# Patient Record
Sex: Female | Born: 1950 | Race: White | Hispanic: No | Marital: Married | Smoking: Former smoker
Health system: Southern US, Community
[De-identification: ages and names within clinical notes are randomized; demographics above are authoritative.]

## PROBLEM LIST (undated history)

## (undated) DIAGNOSIS — F32A Depression, unspecified: Secondary | ICD-10-CM

## (undated) DIAGNOSIS — F419 Anxiety disorder, unspecified: Secondary | ICD-10-CM

## (undated) DIAGNOSIS — R002 Palpitations: Secondary | ICD-10-CM

## (undated) DIAGNOSIS — F329 Major depressive disorder, single episode, unspecified: Secondary | ICD-10-CM

## (undated) DIAGNOSIS — F4310 Post-traumatic stress disorder, unspecified: Secondary | ICD-10-CM

## (undated) DIAGNOSIS — J189 Pneumonia, unspecified organism: Secondary | ICD-10-CM

## (undated) DIAGNOSIS — K635 Polyp of colon: Secondary | ICD-10-CM

## (undated) DIAGNOSIS — E785 Hyperlipidemia, unspecified: Secondary | ICD-10-CM

## (undated) DIAGNOSIS — A6920 Lyme disease, unspecified: Secondary | ICD-10-CM

## (undated) DIAGNOSIS — D126 Benign neoplasm of colon, unspecified: Secondary | ICD-10-CM

## (undated) DIAGNOSIS — T8859XA Other complications of anesthesia, initial encounter: Secondary | ICD-10-CM

## (undated) DIAGNOSIS — Z9889 Other specified postprocedural states: Secondary | ICD-10-CM

## (undated) DIAGNOSIS — S069X9A Unspecified intracranial injury with loss of consciousness of unspecified duration, initial encounter: Secondary | ICD-10-CM

## (undated) DIAGNOSIS — T4145XA Adverse effect of unspecified anesthetic, initial encounter: Secondary | ICD-10-CM

## (undated) DIAGNOSIS — R51 Headache: Secondary | ICD-10-CM

## (undated) DIAGNOSIS — R112 Nausea with vomiting, unspecified: Secondary | ICD-10-CM

## (undated) DIAGNOSIS — M5126 Other intervertebral disc displacement, lumbar region: Secondary | ICD-10-CM

## (undated) DIAGNOSIS — M797 Fibromyalgia: Secondary | ICD-10-CM

## (undated) HISTORY — DX: Hyperlipidemia, unspecified: E78.5

## (undated) HISTORY — PX: TONSILLECTOMY: SHX5217

## (undated) HISTORY — DX: Fibromyalgia: M79.7

## (undated) HISTORY — DX: Lyme disease, unspecified: A69.20

## (undated) HISTORY — PX: WISDOM TOOTH EXTRACTION: SHX21

## (undated) HISTORY — DX: Polyp of colon: K63.5

## (undated) HISTORY — PX: TONSILLECTOMY: SUR1361

## (undated) HISTORY — DX: Benign neoplasm of colon, unspecified: D12.6

## (undated) HISTORY — DX: Depression, unspecified: F32.A

## (undated) HISTORY — PX: BREAST BIOPSY: SHX20

## (undated) HISTORY — DX: Palpitations: R00.2

## (undated) HISTORY — DX: Pneumonia, unspecified organism: J18.9

## (undated) HISTORY — DX: Major depressive disorder, single episode, unspecified: F32.9

## (undated) HISTORY — DX: Headache: R51

## (undated) HISTORY — DX: Anxiety disorder, unspecified: F41.9

## (undated) HISTORY — PX: HAND SURGERY: SHX662

## (undated) HISTORY — DX: Other intervertebral disc displacement, lumbar region: M51.26

## (undated) HISTORY — DX: Unspecified intracranial injury with loss of consciousness of unspecified duration, initial encounter: S06.9X9A

## (undated) HISTORY — DX: Post-traumatic stress disorder, unspecified: F43.10

---
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
## 1976-12-24 HISTORY — PX: BREAST SURGERY: SHX581

## 1982-12-24 HISTORY — PX: TUBAL LIGATION: SHX77

## 1997-12-24 HISTORY — PX: NECK SURGERY: SHX720

## 1998-12-24 HISTORY — PX: NECK SURGERY: SHX720

## 2006-01-04 ENCOUNTER — Other Ambulatory Visit: Admission: RE | Admit: 2006-01-04 | Discharge: 2006-01-04 | Payer: Self-pay | Admitting: Obstetrics and Gynecology

## 2006-06-21 ENCOUNTER — Ambulatory Visit: Payer: Self-pay | Admitting: Family Medicine

## 2006-07-03 ENCOUNTER — Ambulatory Visit: Payer: Self-pay

## 2006-12-20 ENCOUNTER — Ambulatory Visit: Payer: Self-pay | Admitting: Family Medicine

## 2006-12-24 DIAGNOSIS — K635 Polyp of colon: Secondary | ICD-10-CM

## 2006-12-24 HISTORY — DX: Polyp of colon: K63.5

## 2007-03-19 ENCOUNTER — Ambulatory Visit: Payer: Self-pay | Admitting: Family Medicine

## 2007-08-28 DIAGNOSIS — R519 Headache, unspecified: Secondary | ICD-10-CM | POA: Insufficient documentation

## 2007-08-28 DIAGNOSIS — R51 Headache: Secondary | ICD-10-CM

## 2007-08-28 DIAGNOSIS — F329 Major depressive disorder, single episode, unspecified: Secondary | ICD-10-CM

## 2007-09-11 ENCOUNTER — Encounter: Payer: Self-pay | Admitting: Family Medicine

## 2007-11-26 ENCOUNTER — Encounter: Payer: Self-pay | Admitting: Family Medicine

## 2008-07-24 LAB — HM COLONOSCOPY: HM Colonoscopy: NORMAL

## 2008-08-24 ENCOUNTER — Encounter: Payer: Self-pay | Admitting: Family Medicine

## 2009-01-10 ENCOUNTER — Encounter: Payer: Self-pay | Admitting: Family Medicine

## 2009-12-24 DIAGNOSIS — S069X9A Unspecified intracranial injury with loss of consciousness of unspecified duration, initial encounter: Secondary | ICD-10-CM

## 2009-12-24 HISTORY — PX: NECK SURGERY: SHX720

## 2009-12-24 HISTORY — DX: Unspecified intracranial injury with loss of consciousness of unspecified duration, initial encounter: S06.9X9A

## 2010-01-04 ENCOUNTER — Encounter (INDEPENDENT_AMBULATORY_CARE_PROVIDER_SITE_OTHER): Payer: Self-pay | Admitting: *Deleted

## 2010-01-19 ENCOUNTER — Emergency Department (HOSPITAL_COMMUNITY): Admission: EM | Admit: 2010-01-19 | Discharge: 2010-01-19 | Payer: Self-pay | Admitting: Emergency Medicine

## 2010-01-19 ENCOUNTER — Encounter: Payer: Self-pay | Admitting: Family Medicine

## 2010-02-28 ENCOUNTER — Encounter
Admission: RE | Admit: 2010-02-28 | Discharge: 2010-02-28 | Payer: Self-pay | Admitting: Physical Medicine and Rehabilitation

## 2010-03-24 ENCOUNTER — Inpatient Hospital Stay (HOSPITAL_COMMUNITY): Admission: RE | Admit: 2010-03-24 | Discharge: 2010-03-25 | Payer: Self-pay | Admitting: *Deleted

## 2010-03-24 HISTORY — PX: ANTERIOR CERVICAL DECOMP/DISCECTOMY FUSION: SHX1161

## 2010-04-24 ENCOUNTER — Encounter
Admission: RE | Admit: 2010-04-24 | Discharge: 2010-07-23 | Payer: Self-pay | Source: Home / Self Care | Attending: *Deleted | Admitting: *Deleted

## 2010-07-24 ENCOUNTER — Encounter: Admission: RE | Admit: 2010-07-24 | Discharge: 2010-09-18 | Payer: Self-pay | Admitting: *Deleted

## 2010-07-24 LAB — CONVERTED CEMR LAB: Pap Smear: NORMAL

## 2010-09-05 ENCOUNTER — Ambulatory Visit: Payer: Self-pay | Admitting: Family

## 2010-09-05 DIAGNOSIS — D179 Benign lipomatous neoplasm, unspecified: Secondary | ICD-10-CM | POA: Insufficient documentation

## 2010-09-05 DIAGNOSIS — M545 Low back pain: Secondary | ICD-10-CM

## 2010-09-27 ENCOUNTER — Encounter: Payer: Self-pay | Admitting: Family

## 2010-09-27 DIAGNOSIS — E785 Hyperlipidemia, unspecified: Secondary | ICD-10-CM

## 2010-09-29 ENCOUNTER — Encounter: Admission: RE | Admit: 2010-09-29 | Discharge: 2010-09-29 | Payer: Self-pay | Admitting: Unknown Physician Specialty

## 2010-10-06 ENCOUNTER — Ambulatory Visit: Payer: Self-pay | Admitting: Family

## 2010-10-06 DIAGNOSIS — F4310 Post-traumatic stress disorder, unspecified: Secondary | ICD-10-CM

## 2010-10-06 DIAGNOSIS — M25519 Pain in unspecified shoulder: Secondary | ICD-10-CM

## 2010-10-12 ENCOUNTER — Ambulatory Visit (HOSPITAL_BASED_OUTPATIENT_CLINIC_OR_DEPARTMENT_OTHER): Admission: RE | Admit: 2010-10-12 | Discharge: 2010-10-12 | Payer: Self-pay | Admitting: Unknown Physician Specialty

## 2010-10-12 HISTORY — PX: SHOULDER ARTHROSCOPY: SHX128

## 2010-10-18 ENCOUNTER — Encounter: Payer: Self-pay | Admitting: Family

## 2010-11-26 ENCOUNTER — Encounter: Payer: Self-pay | Admitting: Family

## 2011-01-23 NOTE — Miscellaneous (Signed)
Summary: PT Eval/[facility]  PT Eval/[facility]   Imported By: Lanelle Bal 11/04/2010 09:08:47  _____________________________________________________________________  External Attachment:    Type:   Image     Comment:   External Document

## 2011-01-23 NOTE — Letter (Signed)
Summary: New Patient Letter  [facility]  [address] [place], [place] [address]   Phone: [phone]  Fax: [phone]       01/04/2010 MRN: [id number]  [facility] [address] [place], [place]  [address]  Dear Ms. Soeder,   Welcome to [facility] and thank you for choosing [place] as your Primary Care Providers. Enclosed you will find information about our practice that we hope you find helpful. We have also enclosed forms to be filled out prior to your visit. This will provide [place] with the necessary information and facilitate your being seen in a timely manner. If you have any questions, please call us at:  [phone]        and we will be happy to assist you. We look forward to seeing you at your scheduled appointment time.  Appointment   J[facility]ark [phone] @ 2:15            with Dr.  Laury Axon               Sincerely,  Primary Health Care Team  Please arrive 15 minutes early for your first appointment and bring your insurance card. Co-pay is required at the time of your visit.  *****Please call the office if you are not able to keep this appointment. There is a charge of $50.00 if any appointment is not cancelled or rescheduled within 24 hours.

## 2011-01-23 NOTE — Letter (Signed)
Summary: [facility]  [facility]   Imported By: Maryln Gottron 01/30/2010 11:17:11  _____________________________________________________________________  External Attachment:    Type:   Image     Comment:   External Document
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
## 2011-01-23 NOTE — Letter (Signed)
Summary: Records Dated 01-04-06 thru 08-06-10/[facility]  Records Dated 01-04-06 thru 08-06-10/[facility]   Imported By: Lanelle Bal 10/16/2010 09:19:32  _____________________________________________________________________  External Attachment:    Type:   Image     Comment:   External Document

## 2011-01-23 NOTE — Miscellaneous (Signed)
  Clinical Lists Changes  Problems: Added new problem of DYSLIPIDEMIA (ICD-272.4)

## 2011-01-23 NOTE — Miscellaneous (Signed)
Summary: bone density  Clinical Lists Changes  Observations: Added new observation of PAST SURG HX: breast reduction Cervical disc fusion Breast Biopsy--1998/99 Tonsillectomy Anterior / posterior discectomy--1999 Breast Reduction--1978 Anterior Cervical Decompression--03/24/10 Tubal Ligation--1984  (09/27/2010 11:31) Added new observation of BONE DENSITY: normal (12/03/2007 11:34)        Past History:  Past Surgical History: breast reduction Cervical disc fusion Breast Biopsy--1998/99 Tonsillectomy Anterior / posterior discectomy--1999 Breast Reduction--1978 Anterior Cervical Decompression--03/24/10 Tubal Ligation--1984    Preventive Care Screening  Bone Density:    Date:  12/03/2007    Results:  normal

## 2011-01-25 NOTE — Miscellaneous (Signed)
Summary: PT Re Eval/[facility]  PT Re Eval/[facility]   Imported By: Lanelle Bal 12/05/2010 13:11:16  _____________________________________________________________________  External Attachment:    Type:   Image     Comment:   External Document

## 2011-02-21 ENCOUNTER — Encounter: Payer: Self-pay | Admitting: Family

## 2011-02-22 ENCOUNTER — Telehealth (INDEPENDENT_AMBULATORY_CARE_PROVIDER_SITE_OTHER): Payer: Self-pay | Admitting: *Deleted

## 2011-02-28 ENCOUNTER — Ambulatory Visit (INDEPENDENT_AMBULATORY_CARE_PROVIDER_SITE_OTHER): Payer: 59 | Admitting: Family

## 2011-02-28 ENCOUNTER — Encounter: Payer: Self-pay | Admitting: Family

## 2011-02-28 DIAGNOSIS — B029 Zoster without complications: Secondary | ICD-10-CM

## 2011-02-28 DIAGNOSIS — F4310 Post-traumatic stress disorder, unspecified: Secondary | ICD-10-CM

## 2011-03-01 NOTE — Progress Notes (Signed)
  Phone Note Other Incoming   Request: Send information Summary of Call: Request for records received from [facility]. Request forwarded to [facility].  06/23/2008 to current.

## 2011-03-06 NOTE — Miscellaneous (Signed)
Summary: Physical Therapy Re-Evaluation/[facility]  Physical Therapy Re-Evaluation/[facility]   Imported By: Maryln Gottron 02/26/2011 09:50:12  _____________________________________________________________________  External Attachment:    Type:   Image     Comment:   External Document
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
## 2011-03-07 LAB — POCT HEMOGLOBIN-HEMACUE: Hemoglobin: 13.4 g/dL (ref 12.0–15.0)

## 2011-03-19 LAB — COMPREHENSIVE METABOLIC PANEL
ALT: 24 U/L (ref 0–35)
Albumin: 4.2 g/dL (ref 3.5–5.2)
Alkaline Phosphatase: 62 U/L (ref 39–117)
Chloride: 103 mEq/L (ref 96–112)
Glucose, Bld: 92 mg/dL (ref 70–99)
Potassium: 4 mEq/L (ref 3.5–5.1)
Sodium: 138 mEq/L (ref 135–145)
Total Bilirubin: 1 mg/dL (ref 0.3–1.2)
Total Protein: 7.3 g/dL (ref 6.0–8.3)

## 2011-03-19 LAB — URINALYSIS, ROUTINE W REFLEX MICROSCOPIC
Bilirubin Urine: NEGATIVE
Glucose, UA: NEGATIVE mg/dL
Hgb urine dipstick: NEGATIVE
Specific Gravity, Urine: 1.012 (ref 1.005–1.030)
pH: 7.5 (ref 5.0–8.0)

## 2011-03-19 LAB — TYPE AND SCREEN
ABO/RH(D): O POS
Antibody Screen: NEGATIVE

## 2011-03-19 LAB — DIFFERENTIAL
Basophils Absolute: 0 10*3/uL (ref 0.0–0.1)
Basophils Relative: 1 % (ref 0–1)
Eosinophils Absolute: 0.1 10*3/uL (ref 0.0–0.7)
Monocytes Absolute: 0.3 10*3/uL (ref 0.1–1.0)
Monocytes Relative: 6 % (ref 3–12)
Neutrophils Relative %: 56 % (ref 43–77)

## 2011-03-19 LAB — CBC
HCT: 39.6 % (ref 36.0–46.0)
Hemoglobin: 13.5 g/dL (ref 12.0–15.0)
RDW: 14.3 % (ref 11.5–15.5)
WBC: 4.9 10*3/uL (ref 4.0–10.5)

## 2011-03-19 LAB — URINE MICROSCOPIC-ADD ON

## 2011-05-29 ENCOUNTER — Telehealth: Payer: Self-pay | Admitting: *Deleted

## 2011-05-29 NOTE — Telephone Encounter (Signed)
Request received from [employer] for records. Request forwarded to [facility].

## 2011-06-08 ENCOUNTER — Telehealth: Payer: Self-pay | Admitting: *Deleted

## 2011-06-08 NOTE — Telephone Encounter (Signed)
Received request from [employer]ability [employer] for records. Request forwarded to [facility] at [facility].

## 2011-08-14 ENCOUNTER — Encounter: Payer: Self-pay | Admitting: Family

## 2011-08-15 ENCOUNTER — Other Ambulatory Visit (HOSPITAL_COMMUNITY)
Admission: RE | Admit: 2011-08-15 | Discharge: 2011-08-15 | Disposition: A | Discharge status: Home / Self Care | Payer: 59 | Source: Ambulatory Visit | Attending: Family | Admitting: Family

## 2011-08-15 ENCOUNTER — Other Ambulatory Visit: Payer: Self-pay | Admitting: Family

## 2011-08-15 ENCOUNTER — Telehealth: Payer: Self-pay | Admitting: *Deleted

## 2011-08-15 ENCOUNTER — Ambulatory Visit (INDEPENDENT_AMBULATORY_CARE_PROVIDER_SITE_OTHER): Payer: 59 | Admitting: Family

## 2011-08-15 ENCOUNTER — Encounter: Payer: Self-pay | Admitting: Family

## 2011-08-15 DIAGNOSIS — Z0000 Encounter for general adult medical examination without abnormal findings: Secondary | ICD-10-CM | POA: Insufficient documentation

## 2011-08-15 DIAGNOSIS — K5900 Constipation, unspecified: Secondary | ICD-10-CM | POA: Insufficient documentation

## 2011-08-15 DIAGNOSIS — Z01419 Encounter for gynecological examination (general) (routine) without abnormal findings: Secondary | ICD-10-CM

## 2011-08-15 DIAGNOSIS — N63 Unspecified lump in unspecified breast: Secondary | ICD-10-CM

## 2011-08-15 DIAGNOSIS — R51 Headache: Secondary | ICD-10-CM

## 2011-08-15 DIAGNOSIS — N6320 Unspecified lump in the left breast, unspecified quadrant: Secondary | ICD-10-CM | POA: Insufficient documentation

## 2011-08-15 DIAGNOSIS — F4310 Post-traumatic stress disorder, unspecified: Secondary | ICD-10-CM

## 2011-08-15 DIAGNOSIS — E559 Vitamin D deficiency, unspecified: Secondary | ICD-10-CM | POA: Insufficient documentation

## 2011-08-15 LAB — HEPATIC FUNCTION PANEL
Indirect Bilirubin: 1 mg/dL — ABNORMAL HIGH (ref 0.0–0.9)
Total Bilirubin: 1.2 mg/dL (ref 0.3–1.2)
Total Protein: 7.3 g/dL (ref 6.0–8.3)

## 2011-08-15 LAB — CBC WITH DIFFERENTIAL/PLATELET
Eosinophils Absolute: 0.1 10*3/uL (ref 0.0–0.7)
HCT: 40.8 % (ref 36.0–46.0)
Hemoglobin: 12.9 g/dL (ref 12.0–15.0)
Lymphs Abs: 1.5 10*3/uL (ref 0.7–4.0)
MCH: 28.3 pg (ref 26.0–34.0)
MCV: 89.5 fL (ref 78.0–100.0)
Monocytes Absolute: 0.2 10*3/uL (ref 0.1–1.0)
Monocytes Relative: 5 % (ref 3–12)
Neutrophils Relative %: 61 % (ref 43–77)
RBC: 4.56 MIL/uL (ref 3.87–5.11)

## 2011-08-15 LAB — LIPID PANEL
HDL: 51 mg/dL (ref >39)
LDL Cholesterol: 147 mg/dL — ABNORMAL HIGH (ref 0–99)
Triglycerides: 177 mg/dL — ABNORMAL HIGH (ref <150)

## 2011-08-15 LAB — TSH: TSH: 1.446 u[IU]/mL (ref 0.350–4.500)

## 2011-08-15 NOTE — Patient Instructions (Signed)
Try adding 1-2 metamucil wafers a day to promote regularity. Complete your fasting lab work on the first floor.  We will mail you the results of your Pap smear.   You will be contacted about your referral for Mammogram.  Follow up in 6 months, sooner if problems or concerns.

## 2011-08-15 NOTE — Assessment & Plan Note (Signed)
Colo, immunizations, dexa- up to date.  Pap performed today.  Check fasting laboratories.  Pt counseled on exercise- though she is limited by her pain currently. EKG normal.

## 2011-08-15 NOTE — Telephone Encounter (Signed)
Message copied by Kathi Simpers on Wed Aug 15, 2011  3:45 PM ------      Message from: O'SULLIVAN, MELISSA      Created: Wed Aug 15, 2011 10:33 AM       Could you pls call lab and ask them to add on Vit D (diag Vitamin D deficiency) thanks

## 2011-08-15 NOTE — Assessment & Plan Note (Signed)
Recommended that she wear her bite plate at night as this may be due to TMJ.   This may also be contributing to her headaches.

## 2011-08-15 NOTE — Assessment & Plan Note (Signed)
I suspect that this is scar tissue along her incision line but will send for diagnostic mammo to be sure.
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
## 2011-08-15 NOTE — Assessment & Plan Note (Signed)
This is being managed by psychiatry and her therapist.

## 2011-08-15 NOTE — Assessment & Plan Note (Signed)
I recommended that she add a fiber supplement such as the metamucil cookies. This will also help with her abdominal bloating and hemorrhoids. Encourged plenty of water, fresh fruits/veggies as well. Continue prep H for her hemorrhoids.

## 2011-08-16 ENCOUNTER — Telehealth: Payer: Self-pay | Admitting: *Deleted

## 2011-08-16 ENCOUNTER — Other Ambulatory Visit: Payer: Self-pay | Admitting: Family

## 2011-08-16 DIAGNOSIS — N6320 Unspecified lump in the left breast, unspecified quadrant: Secondary | ICD-10-CM

## 2011-08-16 LAB — URINALYSIS, ROUTINE W REFLEX MICROSCOPIC
Bilirubin Urine: NEGATIVE
Glucose, UA: NEGATIVE mg/dL
Hgb urine dipstick: NEGATIVE
Protein, ur: NEGATIVE mg/dL

## 2011-08-16 LAB — BASIC METABOLIC PANEL WITH GFR
CO2: 29 mEq/L (ref 19–32)
Calcium: 9.7 mg/dL (ref 8.4–10.5)
Creat: 0.82 mg/dL (ref 0.50–1.10)
Glucose, Bld: 98 mg/dL (ref 70–99)

## 2011-08-16 NOTE — Telephone Encounter (Signed)
Test has been added 

## 2011-08-16 NOTE — Telephone Encounter (Signed)
Message copied by Kathi Simpers on Thu Aug 16, 2011  8:59 AM ------      Message from: O'SULLIVAN, MELISSA      Created: Wed Aug 15, 2011 10:33 AM       Could you pls call lab and ask them to add on Vit D (diag Vitamin D deficiency) thanks

## 2011-08-17 ENCOUNTER — Encounter: Payer: Self-pay | Admitting: Family

## 2011-08-17 NOTE — Progress Notes (Signed)
  Subjective:    Patient ID: Maria Huber, female    DOB: 1951/03/21, 60 y.o.   MRN: [id number]  HPI    Review of Systems     Past Medical History  Diagnosis Date  . Depression   . Headache   . Palpitations   . Urinary incontinence     History   Social History  . Marital Status: Married    Spouse Name: N/A    Number of Children: 1  . Years of Education: N/A   Occupational History  . Not on file.   Social History Main Topics  . Smoking status: Former Smoker    Quit date: 12/24/1978  . Smokeless tobacco: Not on file  . Alcohol Use: Yes     occasional wine  . Drug Use: No  . Sexually Active: Not on file   Other Topics Concern  . Not on file   Social History Narrative   Regular exercise: noCaffeine Use: rarely. Uses 1/2 and 1/2 coffee, herbal tea.    Past Surgical History  Procedure Date  . Tonsillectomy   . Lumbar disc surgery 1999    anterior/posterior  . Anterior cervical decomp/discectomy fusion 03/24/10  . Breast surgery 1978    reduction  . Breast biopsy 1998/99  . Tubal ligation 1984  . Shoulder arthroscopy 10/12/10    left shoulder    Family History  Problem Relation Age of Onset  . Heart attack Mother   . Cancer Father     lung  . Asthma Sister   . Schizophrenia Brother   . Schizophrenia Sister   . Cancer Other     uterine, ovarian, breast, colon  . Diabetes Other     Allergies  Allergen Reactions  . Famvir (Famciclovir) Nausea And Vomiting  . Hydrocodone     Current Outpatient Prescriptions on File Prior to Visit  Medication Sig Dispense Refill  . aspirin-acetaminophen-caffeine (EXCEDRIN MIGRAINE) 250-250-65 MG per tablet Take as needed for facial pain.       . diazepam (VALIUM) 5 MG tablet Take 5 mg by mouth every 4 (four) hours as needed.        Marland Kitchen ibuprofen (ADVIL) 200 MG tablet Take up to 6 daily as needed for pain.       . traMADol (ULTRAM) 50 MG tablet Take 1-2 tablets every 6 hours as needed for pain.         BP  102/80  Pulse 78  Temp(Src) 98.1 F (36.7 C) (Oral)  Resp 16  Ht 5\' 7"  (1.702 m)  Wt 190 lb 0.6 oz (86.202 kg)  BMI 29.76 kg/m2  LMP 12/24/2008    Objective:   Physical Exam        Assessment & Plan:

## 2011-08-17 NOTE — Assessment & Plan Note (Signed)
Start vitamin D 3000  Units OTC once daily

## 2011-08-20 ENCOUNTER — Encounter: Payer: Self-pay | Admitting: Family

## 2011-08-21 ENCOUNTER — Telehealth: Payer: Self-pay | Admitting: Family

## 2011-08-21 ENCOUNTER — Ambulatory Visit
Admission: RE | Admit: 2011-08-21 | Discharge: 2011-08-21 | Disposition: A | Discharge status: Home / Self Care | Payer: 59 | Source: Ambulatory Visit | Attending: Family | Admitting: Family

## 2011-08-21 DIAGNOSIS — N6320 Unspecified lump in the left breast, unspecified quadrant: Secondary | ICD-10-CM

## 2011-08-21 NOTE — Telephone Encounter (Signed)
Left message on home phone re: normal mammogram.

## 2011-08-23 ENCOUNTER — Ambulatory Visit (HOSPITAL_BASED_OUTPATIENT_CLINIC_OR_DEPARTMENT_OTHER)
Admission: RE | Admit: 2011-08-23 | Discharge: 2011-08-23 | Disposition: A | Discharge status: Home / Self Care | Payer: 59 | Source: Ambulatory Visit | Attending: Orthopedic Surgery | Admitting: Orthopedic Surgery

## 2011-08-23 DIAGNOSIS — Z01812 Encounter for preprocedural laboratory examination: Secondary | ICD-10-CM | POA: Insufficient documentation

## 2011-08-23 DIAGNOSIS — IMO0001 Reserved for inherently not codable concepts without codable children: Secondary | ICD-10-CM | POA: Insufficient documentation

## 2011-08-23 DIAGNOSIS — G894 Chronic pain syndrome: Secondary | ICD-10-CM | POA: Insufficient documentation

## 2011-08-23 DIAGNOSIS — M9420 Chondromalacia, unspecified site: Secondary | ICD-10-CM | POA: Insufficient documentation

## 2011-08-23 DIAGNOSIS — M24139 Other articular cartilage disorders, unspecified wrist: Secondary | ICD-10-CM | POA: Insufficient documentation

## 2011-08-23 LAB — POCT HEMOGLOBIN-HEMACUE: Hemoglobin: 13.9 g/dL (ref 12.0–15.0)
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
## 2011-09-06 ENCOUNTER — Telehealth: Payer: Self-pay | Admitting: Family

## 2011-09-06 NOTE — Telephone Encounter (Signed)
Opened in error

## 2011-12-25 DIAGNOSIS — A6920 Lyme disease, unspecified: Secondary | ICD-10-CM

## 2011-12-25 HISTORY — DX: Lyme disease, unspecified: A69.20

## 2012-02-11 ENCOUNTER — Ambulatory Visit: Payer: 59 | Admitting: Family

## 2012-02-25 ENCOUNTER — Ambulatory Visit: Payer: BC Managed Care – PPO | Attending: Neurology | Admitting: Speech Pathology

## 2012-02-25 DIAGNOSIS — IMO0001 Reserved for inherently not codable concepts without codable children: Secondary | ICD-10-CM | POA: Insufficient documentation

## 2012-02-25 DIAGNOSIS — R41841 Cognitive communication deficit: Secondary | ICD-10-CM | POA: Insufficient documentation

## 2012-02-28 ENCOUNTER — Ambulatory Visit: Payer: BC Managed Care – PPO | Admitting: Speech Pathology

## 2012-03-05 ENCOUNTER — Ambulatory Visit: Payer: BC Managed Care – PPO

## 2012-03-10 ENCOUNTER — Ambulatory Visit: Payer: BC Managed Care – PPO | Admitting: Speech Pathology

## 2012-03-12 ENCOUNTER — Ambulatory Visit: Payer: BC Managed Care – PPO | Admitting: Speech Pathology

## 2012-03-17 ENCOUNTER — Ambulatory Visit: Payer: BC Managed Care – PPO | Admitting: Speech Pathology

## 2012-03-19 ENCOUNTER — Ambulatory Visit: Payer: BC Managed Care – PPO | Admitting: Speech Pathology

## 2012-03-24 ENCOUNTER — Encounter: Payer: 59 | Admitting: Speech Pathology

## 2012-03-26 ENCOUNTER — Ambulatory Visit: Payer: BC Managed Care – PPO | Attending: Neurology

## 2012-03-26 DIAGNOSIS — IMO0001 Reserved for inherently not codable concepts without codable children: Secondary | ICD-10-CM | POA: Insufficient documentation

## 2012-03-26 DIAGNOSIS — R41841 Cognitive communication deficit: Secondary | ICD-10-CM | POA: Insufficient documentation

## 2012-03-31 ENCOUNTER — Encounter: Payer: 59 | Admitting: Speech Pathology

## 2012-04-02 ENCOUNTER — Ambulatory Visit: Payer: BC Managed Care – PPO | Admitting: Speech Pathology

## 2012-04-07 ENCOUNTER — Encounter: Payer: 59 | Admitting: Speech Pathology

## 2012-04-09 ENCOUNTER — Ambulatory Visit: Payer: BC Managed Care – PPO | Admitting: Speech Pathology

## 2012-04-14 ENCOUNTER — Encounter: Payer: 59 | Admitting: Speech Pathology

## 2012-04-16 ENCOUNTER — Encounter: Payer: 59 | Admitting: Speech Pathology

## 2012-10-28 ENCOUNTER — Telehealth: Payer: Self-pay | Admitting: Family

## 2012-10-28 DIAGNOSIS — E785 Hyperlipidemia, unspecified: Secondary | ICD-10-CM

## 2012-10-28 NOTE — Telephone Encounter (Signed)
Pls call pt and let her know that I have reviewed her cholesterol results from Dr. Garlan Fillers office. Cholesterol remains high at 246.   I would like her to work really hard on low fat/low cholesterol diet and exercise.  Plan to repeat in 6 months. If no improvement, we can consider medication at that time.

## 2012-10-29 NOTE — Telephone Encounter (Signed)
Received fast, busy tone at home #, left message on cell# to return my call.

## 2012-10-30 NOTE — Telephone Encounter (Signed)
Pt left message returning my call. Attempted to reach pt and left detailed message of instructions below. Future lab order entered. Copy given to the lab and mailed to pt as reminder. Advised pt to call if any questions.

## 2013-04-23 ENCOUNTER — Ambulatory Visit (INDEPENDENT_AMBULATORY_CARE_PROVIDER_SITE_OTHER): Payer: Medicare Other | Admitting: Psychology

## 2013-04-23 DIAGNOSIS — S069X9A Unspecified intracranial injury with loss of consciousness of unspecified duration, initial encounter: Secondary | ICD-10-CM

## 2013-04-23 DIAGNOSIS — F4310 Post-traumatic stress disorder, unspecified: Secondary | ICD-10-CM

## 2013-05-11 ENCOUNTER — Telehealth (HOSPITAL_COMMUNITY): Payer: Self-pay | Admitting: Psychology

## 2013-05-20 ENCOUNTER — Encounter (HOSPITAL_COMMUNITY): Payer: Self-pay | Admitting: Psychology
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
## 2013-09-17 ENCOUNTER — Other Ambulatory Visit: Payer: Self-pay | Admitting: Obstetrics and Gynecology

## 2013-09-17 DIAGNOSIS — N63 Unspecified lump in unspecified breast: Secondary | ICD-10-CM

## 2013-09-23 ENCOUNTER — Ambulatory Visit (INDEPENDENT_AMBULATORY_CARE_PROVIDER_SITE_OTHER): Payer: Medicare Other | Admitting: Psychology

## 2013-09-23 DIAGNOSIS — F4310 Post-traumatic stress disorder, unspecified: Secondary | ICD-10-CM | POA: Diagnosis not present

## 2013-09-23 DIAGNOSIS — S069X9A Unspecified intracranial injury with loss of consciousness of unspecified duration, initial encounter: Secondary | ICD-10-CM | POA: Diagnosis not present

## 2013-09-24 ENCOUNTER — Ambulatory Visit
Admission: RE | Admit: 2013-09-24 | Discharge: 2013-09-24 | Disposition: A | Discharge status: Home / Self Care | Payer: Medicare Other | Source: Ambulatory Visit | Attending: Obstetrics and Gynecology | Admitting: Obstetrics and Gynecology

## 2013-09-24 DIAGNOSIS — N63 Unspecified lump in unspecified breast: Secondary | ICD-10-CM

## 2013-09-24 DIAGNOSIS — R928 Other abnormal and inconclusive findings on diagnostic imaging of breast: Secondary | ICD-10-CM | POA: Diagnosis not present

## 2013-09-25 ENCOUNTER — Encounter (HOSPITAL_COMMUNITY): Payer: Self-pay | Admitting: Psychology
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
## 2013-10-06 ENCOUNTER — Ambulatory Visit (INDEPENDENT_AMBULATORY_CARE_PROVIDER_SITE_OTHER): Payer: Medicare Other | Admitting: Psychiatry

## 2013-10-06 ENCOUNTER — Encounter (HOSPITAL_COMMUNITY): Payer: Self-pay | Admitting: Psychiatry

## 2013-10-06 VITALS — BP 160/85 | Ht 67.0 in | Wt 183.0 lb

## 2013-10-06 DIAGNOSIS — F329 Major depressive disorder, single episode, unspecified: Secondary | ICD-10-CM

## 2013-10-06 DIAGNOSIS — S069X9A Unspecified intracranial injury with loss of consciousness of unspecified duration, initial encounter: Secondary | ICD-10-CM

## 2013-10-06 MED ORDER — DULOXETINE HCL 30 MG PO CPEP: 30.0000 mg | ORAL_CAPSULE | Freq: Two times a day (BID) | ORAL | Status: DC

## 2013-10-06 NOTE — Patient Instructions (Signed)
Take Cymbalta 30 mg in the am for 2 weeks, then increase to 60 mg
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
## 2013-10-29 ENCOUNTER — Other Ambulatory Visit: Payer: Self-pay

## 2013-11-04 ENCOUNTER — Ambulatory Visit (HOSPITAL_COMMUNITY): Payer: Self-pay | Admitting: Psychiatry

## 2013-11-05 ENCOUNTER — Encounter: Payer: Self-pay | Admitting: Gastroenterology

## 2013-12-01 ENCOUNTER — Ambulatory Visit (INDEPENDENT_AMBULATORY_CARE_PROVIDER_SITE_OTHER): Payer: Medicare Other | Admitting: Gastroenterology

## 2013-12-01 ENCOUNTER — Encounter: Payer: Self-pay | Admitting: Gastroenterology

## 2013-12-01 VITALS — BP 120/70 | HR 80 | Ht 67.0 in | Wt 186.0 lb

## 2013-12-01 DIAGNOSIS — Z800 Family history of malignant neoplasm of digestive organs: Secondary | ICD-10-CM

## 2013-12-01 DIAGNOSIS — R1011 Right upper quadrant pain: Secondary | ICD-10-CM

## 2013-12-01 DIAGNOSIS — K5900 Constipation, unspecified: Secondary | ICD-10-CM

## 2013-12-01 MED ORDER — PEG-KCL-NACL-NASULF-NA ASC-C 100 G PO SOLR: 1.0000 | Freq: Once | ORAL | Status: DC

## 2013-12-01 NOTE — Progress Notes (Addendum)
    History of Present Illness: This is a 62 year old female who relates about 6 discrete episodes of right upper quadrant pain. Pain is described as severe with the sudden onset. The pain tends to last for 2-4 hours at a time with no other associated symptoms. She did have pain in her right shoulder and lower back as well as shortness of breath with the first 2 episodes of pain but these symptoms have not recurred. Her symptoms began in November. Several weeks before her symptoms started she stopped drinking coffee and noted problems with constipation. She has since begun fiber supplements and increased her fluid intake and her constipation has improved. He previously underwent colonoscopy by Dr. Loreta Ave in December 2008 in one colon polyp was removed and we do not have the pathology report available at this time. Denies weight loss, diarrhea, change in stool caliber, melena, hematochezia, nausea, vomiting, dysphagia, reflux symptoms, chest pain.  Review of Systems: Pertinent positive and negative review of systems were noted in the above HPI section. All other review of systems were otherwise negative.  Current Medications, Allergies, Past Medical History, Past Surgical History, Family History and Social History were reviewed in [employer] record.  Physical Exam: General: Well developed , well nourished, no acute distress Head: Normocephalic and atraumatic Eyes:  sclerae anicteric, EOMI Ears: Normal auditory acuity Mouth: No deformity or lesions Neck: Supple, no masses or thyromegaly Lungs: Clear throughout to auscultation Heart: Regular rate and rhythm; no murmurs, rubs or bruits Abdomen: Soft, non tender and non distended. No masses, hepatosplenomegaly or hernias noted. Normal Bowel sounds Rectal: Deferred to colonoscopy Musculoskeletal: Symmetrical with no gross deformities  Skin: No lesions on visible extremities Pulses:  Normal pulses noted Extremities: No clubbing,  cyanosis, edema or deformities noted Neurological: Alert oriented x 4, grossly nonfocal Cervical Nodes:  No significant cervical adenopathy Inguinal Nodes: No significant inguinal adenopathy Psychological:  Alert and cooperative. Normal mood and affect  Assessment and Recommendations:  1. Right upper quadrant pain. Rule out cholelithiasis, constipation leading to pain or musculoskeletal pain. Schedule abdominal ultrasound. Consider upper endoscopy if symptoms persist.  2. Constipation. Continue high fiber diet and increased fluid intake. Schedule colonoscopy. The risks, benefits, and alternatives to colonoscopy with possible biopsy and possible polypectomy were discussed with the patient and they consent to proceed.   3. Family history of colon cancer. Colon polyp, type unknown. Colonoscopy as above.   01/27/14. Records from Dr. Loreta Ave received and reviewed. Colonoscopy performed 11/26/2007 showed a small sessile rectal polyp and was otherwise normal. Pathology read as a hyperplastic polyp.

## 2013-12-01 NOTE — Patient Instructions (Signed)
You have been scheduled for an abdominal ultrasound at [facility] (1st floor of hospital) on 12/04/13 at 7:30am. Please arrive 15 minutes prior to your appointment for registration. Make certain not to have anything to eat or drink 6 hours prior to your appointment. Should you need to reschedule your appointment, please contact radiology at [phone]. This test typically takes about 30 minutes to perform.  You have been scheduled for a colonoscopy with propofol. Please follow written instructions given to you at your visit today.  Please pick up your prep kit at the pharmacy within the next 1-3 days. If you use inhalers (even only as needed), please bring them with you on the day of your procedure. Your physician has requested that you go to www.startemmi.com and enter the access code given to you at your visit today. This web site gives a general overview about your procedure. However, you should still follow specific instructions given to you by our office regarding your preparation for the procedure.  Please have your Primary Care Physician fax all of your recent blood work to our office at [phone].  Thank you for choosing me and [facility].  Venita Lick. Pleas Koch., MD., Clementeen Graham

## 2013-12-04 ENCOUNTER — Ambulatory Visit (HOSPITAL_COMMUNITY)
Admission: RE | Admit: 2013-12-04 | Discharge: 2013-12-04 | Disposition: A | Discharge status: Home / Self Care | Payer: Medicare Other | Source: Ambulatory Visit | Attending: Gastroenterology | Admitting: Gastroenterology

## 2013-12-04 ENCOUNTER — Other Ambulatory Visit: Payer: Self-pay

## 2013-12-04 ENCOUNTER — Telehealth: Payer: Self-pay | Admitting: Gastroenterology

## 2013-12-04 DIAGNOSIS — K7689 Other specified diseases of liver: Secondary | ICD-10-CM | POA: Diagnosis not present

## 2013-12-04 DIAGNOSIS — R935 Abnormal findings on diagnostic imaging of other abdominal regions, including retroperitoneum: Secondary | ICD-10-CM | POA: Diagnosis not present

## 2013-12-04 DIAGNOSIS — N269 Renal sclerosis, unspecified: Secondary | ICD-10-CM | POA: Insufficient documentation

## 2013-12-04 DIAGNOSIS — R109 Unspecified abdominal pain: Secondary | ICD-10-CM | POA: Insufficient documentation

## 2013-12-04 DIAGNOSIS — G8929 Other chronic pain: Secondary | ICD-10-CM

## 2013-12-04 DIAGNOSIS — K8020 Calculus of gallbladder without cholecystitis without obstruction: Secondary | ICD-10-CM

## 2013-12-04 DIAGNOSIS — K828 Other specified diseases of gallbladder: Secondary | ICD-10-CM

## 2013-12-04 DIAGNOSIS — R1011 Right upper quadrant pain: Secondary | ICD-10-CM

## 2013-12-04 NOTE — Telephone Encounter (Signed)
See US results for additional details 

## 2013-12-06 ENCOUNTER — Encounter: Payer: Self-pay | Admitting: Gastroenterology

## 2013-12-06 DIAGNOSIS — K8020 Calculus of gallbladder without cholecystitis without obstruction: Secondary | ICD-10-CM

## 2013-12-07 DIAGNOSIS — F4310 Post-traumatic stress disorder, unspecified: Secondary | ICD-10-CM | POA: Diagnosis not present

## 2013-12-07 DIAGNOSIS — R413 Other amnesia: Secondary | ICD-10-CM | POA: Diagnosis not present

## 2013-12-07 DIAGNOSIS — R1011 Right upper quadrant pain: Secondary | ICD-10-CM | POA: Diagnosis not present

## 2013-12-07 DIAGNOSIS — E559 Vitamin D deficiency, unspecified: Secondary | ICD-10-CM | POA: Diagnosis not present

## 2013-12-07 DIAGNOSIS — Z87820 Personal history of traumatic brain injury: Secondary | ICD-10-CM | POA: Diagnosis not present

## 2013-12-15 ENCOUNTER — Encounter: Payer: Self-pay | Admitting: Gastroenterology

## 2013-12-15 NOTE — Telephone Encounter (Signed)
Dr. Russella Dar please see the notes from the pt below regarding her pain. Please advise.

## 2013-12-21 ENCOUNTER — Ambulatory Visit (INDEPENDENT_AMBULATORY_CARE_PROVIDER_SITE_OTHER): Payer: Self-pay | Admitting: General Surgery

## 2013-12-28 ENCOUNTER — Ambulatory Visit (INDEPENDENT_AMBULATORY_CARE_PROVIDER_SITE_OTHER): Payer: Medicare Other | Admitting: General Surgery

## 2013-12-28 ENCOUNTER — Encounter (INDEPENDENT_AMBULATORY_CARE_PROVIDER_SITE_OTHER): Payer: Self-pay | Admitting: General Surgery

## 2013-12-28 VITALS — BP 122/68 | HR 72 | Temp 98.3°F | Resp 14 | Ht 67.0 in | Wt 187.6 lb

## 2013-12-28 DIAGNOSIS — K8020 Calculus of gallbladder without cholecystitis without obstruction: Secondary | ICD-10-CM
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
## 2014-01-04 NOTE — Telephone Encounter (Signed)
FW: Visit Follow-Up Question    Ladene Artist, MD        Sent: Mon January 04, 2014 3:49 PM    To: Kellie Moor, RN                   Message     We can set up follow up RUQ [place] as suggested by radiologist. It is not typical to do a follow up US for gallstones and gallstones usually do not pass on their own.            Patient is scheduled for [place] [facility] for

## 2014-01-04 NOTE — Telephone Encounter (Signed)
Patient is scheduled for limited RUQ [place] on 01/12/14 8:30 at [facility].  She is aware to be NPO after midnight.

## 2014-01-12 ENCOUNTER — Ambulatory Visit (HOSPITAL_COMMUNITY)
Admission: RE | Admit: 2014-01-12 | Discharge: 2014-01-12 | Disposition: A | Discharge status: Home / Self Care | Payer: Medicare Other | Source: Ambulatory Visit | Attending: Gastroenterology | Admitting: Gastroenterology

## 2014-01-12 DIAGNOSIS — K8020 Calculus of gallbladder without cholecystitis without obstruction: Secondary | ICD-10-CM | POA: Insufficient documentation

## 2014-01-12 DIAGNOSIS — K7689 Other specified diseases of liver: Secondary | ICD-10-CM | POA: Diagnosis not present

## 2014-01-24 DIAGNOSIS — D126 Benign neoplasm of colon, unspecified: Secondary | ICD-10-CM

## 2014-01-24 HISTORY — DX: Benign neoplasm of colon, unspecified: D12.6

## 2014-01-26 ENCOUNTER — Ambulatory Visit (AMBULATORY_SURGERY_CENTER): Payer: Medicare Other | Admitting: Gastroenterology

## 2014-01-26 ENCOUNTER — Encounter: Payer: Self-pay | Admitting: Gastroenterology

## 2014-01-26 VITALS — BP 140/78 | HR 61 | Temp 96.8°F | Resp 28 | Ht 67.0 in | Wt 186.0 lb

## 2014-01-26 DIAGNOSIS — D126 Benign neoplasm of colon, unspecified: Secondary | ICD-10-CM | POA: Diagnosis not present

## 2014-01-26 DIAGNOSIS — Z800 Family history of malignant neoplasm of digestive organs: Secondary | ICD-10-CM | POA: Diagnosis not present

## 2014-01-26 DIAGNOSIS — F411 Generalized anxiety disorder: Secondary | ICD-10-CM | POA: Diagnosis not present

## 2014-01-26 DIAGNOSIS — Z86010 Personal history of colonic polyps: Secondary | ICD-10-CM

## 2014-01-26 DIAGNOSIS — R1011 Right upper quadrant pain: Secondary | ICD-10-CM

## 2014-01-26 DIAGNOSIS — Z1211 Encounter for screening for malignant neoplasm of colon: Secondary | ICD-10-CM | POA: Diagnosis not present

## 2014-01-26 MED ORDER — SODIUM CHLORIDE 0.9 % IV SOLN: 500.0000 mL | INTRAVENOUS | Status: DC

## 2014-01-26 NOTE — Op Note (Signed)
[place]  [address] [place], [id number]   COLONOSCOPY PROCEDURE REPORT  PATIENT: Maria Huber, Large  MR#: [id number] BIRTHDATE: September 17, 1951 , 62  yrs. old GENDER: Female ENDOSCOPIST: Ladene Artist, MD, [facility] REFERRED HE:NIDPOEUM Theodis Sato, M.D. PROCEDURE DATE:  01/26/2014 PROCEDURE:   Colonoscopy with snare polypectomy First Screening Colonoscopy - Avg.  risk and is 50 yrs.  old or older - No.  Prior Negative Screening - Now for repeat screening. N/A  History of Adenoma - Now for follow-up colonoscopy & has been > or = to 3 yrs.  Yes hx of adenoma.  Has been 3 or more years since last colonoscopy. ASA CLASS:   Class II INDICATIONS:Patient's personal history of colon polyps-type unknown and Patient's family history of colon cancer, distant relative. MEDICATIONS: MAC sedation, administered by CRNA and propofol (Diprivan) 280mg  IV DESCRIPTION OF PROCEDURE:   After the risks benefits and alternatives of the procedure were thoroughly explained, informed consent was obtained.  A digital rectal exam revealed no abnormalities of the rectum.   The LB [id number]  endoscope was introduced through the anus and advanced to the cecum, which was identified by both the appendix and ileocecal valve. No adverse events experienced.   The quality of the prep was excellent, using MoviPrep  The instrument was then slowly withdrawn as the colon was fully examined.  COLON FINDINGS: Three sessile polyps measuring 5-6 mm in size were found in the transverse colon.  A polypectomy was performed with a cold snare.  The resection was complete and the polyp tissue was completely retrieved.   Two sessile polyps measuring 4-6 mm in size were found in the sigmoid colon.  A polypectomy was performed with a cold snare.  The resection was complete and the polyp tissue was completely retrieved.   The colon was otherwise normal.  There was no diverticulosis, inflammation, polyps or  cancers unless previously stated.  Retroflexed views revealed internal hemorrhoids. The time to cecum=4 minutes 30 seconds.  Withdrawal time=12 minutes 12 seconds.  The scope was withdrawn and the procedure completed. COMPLICATIONS: There were no complications.  ENDOSCOPIC IMPRESSION: 1.   Three sessile polyps measuring 5-6 mm in the transverse colon; polypectomy performed with a cold snare 2.   Two sessile polyps measuring 4-6 mm in the sigmoid colon; polypectomy performed with a cold snare 3.   Small internal hemorrhoids  RECOMMENDATIONS: 1.  Await pathology results 2.  Repeat colonoscopy in 3 years if 3 or more polyps adenomatous; otherwise 5 years  eSigned:  Ladene Artist, MD, [facility] 01/26/2014 2:00 PM

## 2014-01-26 NOTE — Patient Instructions (Signed)
Discharge instructions given with verbal understanding. Handouts on polyps and hemorrhoids. Resume previous medications. YOU HAD AN ENDOSCOPIC PROCEDURE TODAY AT THE [facility]: Refer to the procedure report that was given to you for any specific questions about what was found during the examination.  If the procedure report does not answer your questions, please call your gastroenterologist to clarify.  If you requested that your care partner not be given the details of your procedure findings, then the procedure report has been included in a sealed envelope for you to review at your convenience later.  YOU SHOULD EXPECT: Some feelings of bloating in the abdomen. Passage of more gas than usual.  Walking can help get rid of the air that was put into your GI tract during the procedure and reduce the bloating. If you had a lower endoscopy (such as a colonoscopy or flexible sigmoidoscopy) you may notice spotting of blood in your stool or on the toilet paper. If you underwent a bowel prep for your procedure, then you may not have a normal bowel movement for a few days.  DIET: Your first meal following the procedure should be a light meal and then it is ok to progress to your normal diet.  A half-sandwich or bowl of soup is an example of a good first meal.  Heavy or fried foods are harder to digest and may make you feel nauseous or bloated.  Likewise meals heavy in dairy and vegetables can cause extra gas to form and this can also increase the bloating.  Drink plenty of fluids but you should avoid alcoholic beverages for 24 hours.  ACTIVITY: Your care partner should take you home directly after the procedure.  You should plan to take it easy, moving slowly for the rest of the day.  You can resume normal activity the day after the procedure however you should NOT DRIVE or use heavy machinery for 24 hours (because of the sedation medicines used during the test).    SYMPTOMS TO REPORT  IMMEDIATELY: A gastroenterologist can be reached at any hour.  During normal business hours, 8:30 AM to 5:00 PM Monday through Friday, call [phone].  After hours and on weekends, please call the GI answering service at [phone] who will take a message and have the physician on call contact you.   Following lower endoscopy (colonoscopy or flexible sigmoidoscopy):  Excessive amounts of blood in the stool  Significant tenderness or worsening of abdominal pains  Swelling of the abdomen that is new, acute  Fever of 100F or higher  FOLLOW UP: If any biopsies were taken you will be contacted by phone or by letter within the next 1-3 weeks.  Call your gastroenterologist if you have not heard about the biopsies in 3 weeks.  Our staff will call the home number listed on your records the next business day following your procedure to check on you and address any questions or concerns that you may have at that time regarding the information given to you following your procedure. This is a courtesy call and so if there is no answer at the home number and we have not heard from you through the emergency physician on call, we will assume that you have returned to your regular daily activities without incident.  SIGNATURES/CONFIDENTIALITY: You and/or your care partner have signed paperwork which will be entered into your electronic medical record.  These signatures attest to the fact that that the information above on your After Visit Summary   has been reviewed and is understood.  Full responsibility of the confidentiality of this discharge information lies with you and/or your care-partner. 

## 2014-01-26 NOTE — Progress Notes (Signed)
Called to room to assist during endoscopic procedure.  Patient ID and intended procedure confirmed with present staff. Received instructions for my participation in the procedure from the performing physician.  

## 2014-01-26 NOTE — Progress Notes (Signed)
A/ox3 pleased with MAC, report to Celia RN 

## 2014-01-27 ENCOUNTER — Telehealth: Payer: Self-pay | Admitting: *Deleted

## 2014-01-27 NOTE — Telephone Encounter (Signed)
Attempted to call x2 number has been disconnected.

## 2014-02-02 ENCOUNTER — Encounter: Payer: Self-pay | Admitting: Gastroenterology

## 2014-02-08 ENCOUNTER — Encounter: Payer: Self-pay | Admitting: Gastroenterology

## 2014-02-11 ENCOUNTER — Encounter: Payer: Self-pay | Admitting: Family Medicine

## 2014-02-17 DIAGNOSIS — Z124 Encounter for screening for malignant neoplasm of cervix: Secondary | ICD-10-CM | POA: Diagnosis not present

## 2014-02-19 ENCOUNTER — Telehealth (HOSPITAL_COMMUNITY): Payer: Self-pay | Admitting: *Deleted

## 2014-12-03 ENCOUNTER — Emergency Department (HOSPITAL_COMMUNITY): Payer: Medicare Other

## 2014-12-03 ENCOUNTER — Emergency Department (HOSPITAL_COMMUNITY)
Admission: EM | Admit: 2014-12-03 | Discharge: 2014-12-03 | Disposition: A | Discharge status: Home / Self Care | Payer: Medicare Other | Attending: Emergency Medicine | Admitting: Emergency Medicine

## 2014-12-03 ENCOUNTER — Encounter (HOSPITAL_COMMUNITY): Payer: Self-pay | Admitting: *Deleted

## 2014-12-03 DIAGNOSIS — Z8701 Personal history of pneumonia (recurrent): Secondary | ICD-10-CM | POA: Insufficient documentation

## 2014-12-03 DIAGNOSIS — Z86010 Personal history of colonic polyps: Secondary | ICD-10-CM | POA: Diagnosis not present

## 2014-12-03 DIAGNOSIS — M545 Low back pain, unspecified: Secondary | ICD-10-CM

## 2014-12-03 DIAGNOSIS — Z8639 Personal history of other endocrine, nutritional and metabolic disease: Secondary | ICD-10-CM | POA: Diagnosis not present

## 2014-12-03 DIAGNOSIS — Z8659 Personal history of other mental and behavioral disorders: Secondary | ICD-10-CM | POA: Insufficient documentation

## 2014-12-03 DIAGNOSIS — Z87891 Personal history of nicotine dependence: Secondary | ICD-10-CM | POA: Diagnosis not present

## 2014-12-03 DIAGNOSIS — M5136 Other intervertebral disc degeneration, lumbar region: Secondary | ICD-10-CM | POA: Diagnosis not present

## 2014-12-03 DIAGNOSIS — Z79899 Other long term (current) drug therapy: Secondary | ICD-10-CM | POA: Diagnosis not present

## 2014-12-03 DIAGNOSIS — Z87820 Personal history of traumatic brain injury: Secondary | ICD-10-CM | POA: Insufficient documentation

## 2014-12-03 MED ORDER — TRAMADOL HCL 50 MG PO TABS: 50.0000 mg | ORAL_TABLET | Freq: Four times a day (QID) | ORAL | Status: DC | PRN

## 2014-12-03 MED ORDER — HYDROMORPHONE HCL 1 MG/ML IJ SOLN
1.0000 mg | Freq: Once | INTRAMUSCULAR | Status: AC
  Administered 2014-12-03: 1 mg via INTRAVENOUS
  Filled 2014-12-03: qty 1

## 2014-12-03 MED ORDER — ONDANSETRON HCL 4 MG/2ML IJ SOLN
4.0000 mg | Freq: Once | INTRAMUSCULAR | Status: AC
  Administered 2014-12-03: 4 mg via INTRAVENOUS
  Filled 2014-12-03: qty 2

## 2014-12-03 MED ORDER — BACLOFEN 10 MG PO TABS: 10.0000 mg | ORAL_TABLET | Freq: Three times a day (TID) | ORAL | Status: DC | PRN

## 2014-12-03 NOTE — Discharge Instructions (Signed)
Back Pain, Adult Low back pain is very common. About 1 in 5 people have back pain.The cause of low back pain is rarely dangerous. The pain often gets better over time.About half of people with a sudden onset of back pain feel better in just 2 weeks. About 8 in 10 people feel better by 6 weeks.  CAUSES Some common causes of back pain include:  Strain of the muscles or ligaments supporting the spine.  Wear and tear (degeneration) of the spinal discs.  Arthritis.  Direct injury to the back. DIAGNOSIS Most of the time, the direct cause of low back pain is not known.However, back pain can be treated effectively even when the exact cause of the pain is unknown.Answering your caregiver's questions about your overall health and symptoms is one of the most accurate ways to make sure the cause of your pain is not dangerous. If your caregiver needs more information, he or she may order lab work or imaging tests (X-rays or MRIs).However, even if imaging tests show changes in your back, this usually does not require surgery. HOME CARE INSTRUCTIONS For many people, back pain returns.Since low back pain is rarely dangerous, it is often a condition that people can learn to manageon their own.   Remain active. It is stressful on the back to sit or stand in one place. Do not sit, drive, or stand in one place for more than 30 minutes at a time. Take short walks on level surfaces as soon as pain allows.Try to increase the length of time you walk each day.  Do not stay in bed.Resting more than 1 or 2 days can delay your recovery.  Do not avoid exercise or work.Your body is made to move.It is not dangerous to be active, even though your back may hurt.Your back will likely heal faster if you return to being active before your pain is gone.  Pay attention to your body when you bend and lift. Many people have less discomfortwhen lifting if they bend their knees, keep the load close to their bodies,and  avoid twisting. Often, the most comfortable positions are those that put less stress on your recovering back.  Find a comfortable position to sleep. Use a firm mattress and lie on your side with your knees slightly bent. If you lie on your back, put a pillow under your knees.  Only take over-the-counter or prescription medicines as directed by your caregiver. Over-the-counter medicines to reduce pain and inflammation are often the most helpful.Your caregiver may prescribe muscle relaxant drugs.These medicines help dull your pain so you can more quickly return to your normal activities and healthy exercise.  Put ice on the injured area.  Put ice in a plastic bag.  Place a towel between your skin and the bag.  Leave the ice on for 15-20 minutes, 03-04 times a day for the first 2 to 3 days. After that, ice and heat may be alternated to reduce pain and spasms.  Ask your caregiver about trying back exercises and gentle massage. This may be of some benefit.  Avoid feeling anxious or stressed.Stress increases muscle tension and can worsen back pain.It is important to recognize when you are anxious or stressed and learn ways to manage it.Exercise is a great option. SEEK MEDICAL CARE IF:  You have pain that is not relieved with rest or medicine.  You have pain that does not improve in 1 week.  You have new symptoms.  You are generally not feeling well. SEEK   IMMEDIATE MEDICAL CARE IF:   You have pain that radiates from your back into your legs.  You develop new bowel or bladder control problems.  You have unusual weakness or numbness in your arms or legs.  You develop nausea or vomiting.  You develop abdominal pain.  You feel faint. Document Released: 12/10/2005 Document Revised: 06/10/2012 Document Reviewed: 04/13/2014 ExitCare Patient Information 2015 [employer]. This information is not intended to replace advice given to you by your health care provider. Make sure you  discuss any questions you have with your health care provider.  

## 2014-12-03 NOTE — ED Notes (Signed)
Pt states that she has a "slipped disc" pt states that she has pain all over her back. Pt has hx of back pain and has received steroid shots in the past.
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
## 2014-12-03 NOTE — ED Notes (Signed)
Pt dozing at present

## 2014-12-07 ENCOUNTER — Ambulatory Visit (INDEPENDENT_AMBULATORY_CARE_PROVIDER_SITE_OTHER): Payer: Medicare Other | Admitting: Physician Assistant

## 2014-12-07 ENCOUNTER — Encounter: Payer: Self-pay | Admitting: Physician Assistant

## 2014-12-07 VITALS — BP 112/80 | HR 77 | Temp 98.2°F | Resp 16 | Ht 67.0 in | Wt 175.0 lb

## 2014-12-07 DIAGNOSIS — M5416 Radiculopathy, lumbar region: Secondary | ICD-10-CM

## 2014-12-07 DIAGNOSIS — M5410 Radiculopathy, site unspecified: Secondary | ICD-10-CM | POA: Diagnosis not present

## 2014-12-07 DIAGNOSIS — F4310 Post-traumatic stress disorder, unspecified: Secondary | ICD-10-CM | POA: Diagnosis not present

## 2014-12-07 MED ORDER — METHYLPREDNISOLONE (PAK) 4 MG PO TABS: ORAL_TABLET | ORAL | Status: DC

## 2014-12-07 MED ORDER — METHYLPREDNISOLONE ACETATE 80 MG/ML IJ SUSP
80.0000 mg | Freq: Once | INTRAMUSCULAR | Status: AC
  Administered 2014-12-07: 80 mg via INTRAMUSCULAR
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
## 2014-12-07 NOTE — Progress Notes (Signed)
Pre visit review using our clinic review tool, if applicable. No additional management support is needed unless otherwise documented below in the visit note/SLS  

## 2014-12-07 NOTE — Patient Instructions (Signed)
Please begin steroid pack tomorrow, taking as directed.  Avoid heavy lifting or overexertion.  Apply topical Aspercreme to your lower back.  I have also sent in a prescription for Zofran for you to take for nausea in case you experience that with the steroid pills.  I will call you as soon as I have your results of the MRI.  I will get you in as soon as possible with Neurosurgery. If symptoms acutely worsen, please go straight to the ER.

## 2014-12-08 ENCOUNTER — Telehealth: Payer: Self-pay | Admitting: Physician Assistant

## 2014-12-08 DIAGNOSIS — M5416 Radiculopathy, lumbar region: Secondary | ICD-10-CM

## 2014-12-08 NOTE — Assessment & Plan Note (Signed)
Doing very well. Has wonderful support network at home. Continue support group sessions.

## 2014-12-08 NOTE — Telephone Encounter (Signed)
MRi results are in -- there are several areas where the disks are degenerating and there is evidence of herniation. There is one region in particular that is likely contributing to her symptoms.  Continue medications as directed from visit.  I am setting her up with Dr. Ellene Route (Neurosurgery) for further management.

## 2014-12-08 NOTE — Telephone Encounter (Signed)
LMOM with contact name and number for return call RE: results and further provider instructions; instructed pt will not be in office tomorrow; please ask for clinical assistance to get her results/SLS

## 2014-12-08 NOTE — Assessment & Plan Note (Signed)
IM Depo-Medrol given in clinic. Will start Medrol Dosepak tomorrow. MRI ordered. Will be obtained today at an outside imaging suite. We'll refer to neurosurgery based on results. Strict follow-up precautions given to patient and her husband.

## 2014-12-08 NOTE — Telephone Encounter (Signed)
Patient informed, understood & agreed/SLS  

## 2014-12-11 DIAGNOSIS — R079 Chest pain, unspecified: Secondary | ICD-10-CM | POA: Diagnosis not present

## 2014-12-11 DIAGNOSIS — R0602 Shortness of breath: Secondary | ICD-10-CM | POA: Diagnosis not present

## 2014-12-11 DIAGNOSIS — K8050 Calculus of bile duct without cholangitis or cholecystitis without obstruction: Secondary | ICD-10-CM | POA: Diagnosis not present

## 2014-12-11 DIAGNOSIS — R0789 Other chest pain: Secondary | ICD-10-CM | POA: Diagnosis not present

## 2014-12-11 DIAGNOSIS — R072 Precordial pain: Secondary | ICD-10-CM | POA: Diagnosis not present

## 2014-12-11 DIAGNOSIS — K8010 Calculus of gallbladder with chronic cholecystitis without obstruction: Secondary | ICD-10-CM | POA: Diagnosis not present

## 2014-12-11 DIAGNOSIS — K8020 Calculus of gallbladder without cholecystitis without obstruction: Secondary | ICD-10-CM | POA: Diagnosis not present

## 2014-12-30 ENCOUNTER — Other Ambulatory Visit (INDEPENDENT_AMBULATORY_CARE_PROVIDER_SITE_OTHER): Payer: Self-pay | Admitting: General Surgery

## 2014-12-30 DIAGNOSIS — K8020 Calculus of gallbladder without cholecystitis without obstruction: Secondary | ICD-10-CM | POA: Diagnosis not present

## 2014-12-31 NOTE — Pre-Procedure Instructions (Signed)
Maria Huber  12/31/2014   Your procedure is scheduled on:  Tuesday, January 12.  Report to [facility] Admitting at 5:30 AM.  Call this number if you have problems the morning of surgery: [phone]   Remember:   Do not eat food or drink liquids after midnight.   Take these medicines the morning of surgery with A SIP OF WATER: None.               Stop taking Aspirin, Coumadin, Plavix, Effient and Herbal medications.  Do not take any NSAIDs ie: Ibuprofen,  Advil,Naproxen or any medication containing Aspirin.   Do not wear jewelry, make-up or nail polish.  Do not wear lotions, powders, or perfumes.   Do not shave 48 hours prior to surgery.   Do not bring valuables to the hospital. [name]   is not responsible  for any belongings or valuables.               Contacts, dentures or bridgework may not be worn into surgery.  Leave suitcase in the car. After surgery it may be brought to your room.  For patients admitted to the hospital, discharge time is determined by your treatment team.               Patients discharged the day of surgery will not be allowed to drive home.  Name and phone number of your driver: -   Special Instructions: Review  [place] - Preparing For Surgery.   Please read over the following fact sheets that you were given: Pain Booklet, Coughing and Deep Breathing and Surgical Site Infection Prevention

## 2015-01-03 ENCOUNTER — Encounter (HOSPITAL_COMMUNITY): Payer: Self-pay

## 2015-01-03 ENCOUNTER — Encounter (HOSPITAL_COMMUNITY)
Admission: RE | Admit: 2015-01-03 | Discharge: 2015-01-03 | Disposition: A | Discharge status: Home / Self Care | Payer: Medicare Other | Source: Ambulatory Visit | Attending: General Surgery | Admitting: General Surgery

## 2015-01-03 DIAGNOSIS — K8020 Calculus of gallbladder without cholecystitis without obstruction: Secondary | ICD-10-CM | POA: Diagnosis not present

## 2015-01-03 DIAGNOSIS — F329 Major depressive disorder, single episode, unspecified: Secondary | ICD-10-CM | POA: Diagnosis not present

## 2015-01-03 DIAGNOSIS — M797 Fibromyalgia: Secondary | ICD-10-CM | POA: Diagnosis not present

## 2015-01-03 DIAGNOSIS — Z87891 Personal history of nicotine dependence: Secondary | ICD-10-CM | POA: Diagnosis not present

## 2015-01-03 DIAGNOSIS — K851 Biliary acute pancreatitis: Secondary | ICD-10-CM | POA: Diagnosis not present

## 2015-01-03 HISTORY — DX: Adverse effect of unspecified anesthetic, initial encounter: T41.45XA

## 2015-01-03 HISTORY — DX: Nausea with vomiting, unspecified: R11.2

## 2015-01-03 HISTORY — DX: Other specified postprocedural states: Z98.890

## 2015-01-03 HISTORY — DX: Other complications of anesthesia, initial encounter: T88.59XA

## 2015-01-03 LAB — COMPREHENSIVE METABOLIC PANEL
ALBUMIN: 4.1 g/dL (ref 3.5–5.2)
ALT: 14 U/L (ref 0–35)
ANION GAP: 6 (ref 5–15)
AST: 23 U/L (ref 0–37)
Alkaline Phosphatase: 64 U/L (ref 39–117)
BILIRUBIN TOTAL: 1.1 mg/dL (ref 0.3–1.2)
BUN: 15 mg/dL (ref 6–23)
CO2: 28 mmol/L (ref 19–32)
Calcium: 9.1 mg/dL (ref 8.4–10.5)
Chloride: 105 mEq/L (ref 96–112)
Creatinine, Ser: 0.85 mg/dL (ref 0.50–1.10)
GFR calc Af Amer: 83 mL/min — ABNORMAL LOW (ref >90)
GFR, EST NON AFRICAN AMERICAN: 71 mL/min — AB (ref >90)
GLUCOSE: 70 mg/dL (ref 70–99)
Potassium: 3.8 mmol/L (ref 3.5–5.1)
Sodium: 139 mmol/L (ref 135–145)
TOTAL PROTEIN: 7.2 g/dL (ref 6.0–8.3)

## 2015-01-03 LAB — CBC WITH DIFFERENTIAL/PLATELET
Basophils Absolute: 0 K/uL (ref 0.0–0.1)
Basophils Relative: 1 % (ref 0–1)
Eosinophils Absolute: 0.1 K/uL (ref 0.0–0.7)
Eosinophils Relative: 2 % (ref 0–5)
HCT: 35.8 % — ABNORMAL LOW (ref 36.0–46.0)
Hemoglobin: 11.5 g/dL — ABNORMAL LOW (ref 12.0–15.0)
Lymphocytes Relative: 29 % (ref 12–46)
Lymphs Abs: 1.5 K/uL (ref 0.7–4.0)
MCH: 28 pg (ref 26.0–34.0)
MCHC: 32.1 g/dL (ref 30.0–36.0)
MCV: 87.3 fL (ref 78.0–100.0)
Monocytes Absolute: 0.3 K/uL (ref 0.1–1.0)
Monocytes Relative: 6 % (ref 3–12)
Neutro Abs: 3.4 K/uL (ref 1.7–7.7)
Neutrophils Relative %: 62 % (ref 43–77)
Platelets: 336 K/uL (ref 150–400)
RBC: 4.1 MIL/uL (ref 3.87–5.11)
RDW: 13.3 % (ref 11.5–15.5)
WBC: 5.4 K/uL (ref 4.0–10.5)

## 2015-01-03 LAB — LIPASE, BLOOD: LIPASE: 66 U/L — AB (ref 11–59)

## 2015-01-03 MED ORDER — DEXTROSE 5 % IV SOLN
2.0000 g | INTRAVENOUS | Status: AC
  Administered 2015-01-04: 2 g via INTRAVENOUS
  Filled 2015-01-03: qty 2

## 2015-01-04 ENCOUNTER — Ambulatory Visit (HOSPITAL_COMMUNITY)
Admission: RE | Admit: 2015-01-04 | Discharge: 2015-01-04 | Disposition: A | Discharge status: Home / Self Care | Payer: Medicare Other | Source: Ambulatory Visit | Attending: General Surgery | Admitting: General Surgery

## 2015-01-04 ENCOUNTER — Ambulatory Visit (HOSPITAL_COMMUNITY): Payer: Medicare Other

## 2015-01-04 ENCOUNTER — Encounter (HOSPITAL_COMMUNITY)
Admission: RE | Disposition: A | Discharge status: Home / Self Care | Payer: Self-pay | Source: Ambulatory Visit | Attending: General Surgery

## 2015-01-04 ENCOUNTER — Encounter (HOSPITAL_COMMUNITY): Payer: Self-pay | Admitting: *Deleted

## 2015-01-04 ENCOUNTER — Ambulatory Visit (HOSPITAL_COMMUNITY): Payer: Medicare Other | Admitting: Anesthesiology

## 2015-01-04 DIAGNOSIS — K8010 Calculus of gallbladder with chronic cholecystitis without obstruction: Secondary | ICD-10-CM | POA: Diagnosis not present

## 2015-01-04 DIAGNOSIS — K851 Biliary acute pancreatitis: Secondary | ICD-10-CM | POA: Diagnosis not present

## 2015-01-04 DIAGNOSIS — Z87891 Personal history of nicotine dependence: Secondary | ICD-10-CM | POA: Diagnosis not present

## 2015-01-04 DIAGNOSIS — M797 Fibromyalgia: Secondary | ICD-10-CM | POA: Insufficient documentation

## 2015-01-04 DIAGNOSIS — F329 Major depressive disorder, single episode, unspecified: Secondary | ICD-10-CM | POA: Diagnosis not present

## 2015-01-04 DIAGNOSIS — K8020 Calculus of gallbladder without cholecystitis without obstruction: Secondary | ICD-10-CM | POA: Insufficient documentation

## 2015-01-04 DIAGNOSIS — Z419 Encounter for procedure for purposes other than remedying health state, unspecified: Secondary | ICD-10-CM

## 2015-01-04 HISTORY — PX: CHOLECYSTECTOMY: SHX55

## 2015-01-04 SURGERY — LAPAROSCOPIC CHOLECYSTECTOMY WITH INTRAOPERATIVE CHOLANGIOGRAM
Anesthesia: General | Site: Abdomen

## 2015-01-04 MED ORDER — LIDOCAINE HCL (CARDIAC) 20 MG/ML IV SOLN
INTRAVENOUS | Status: AC
  Filled 2015-01-04: qty 5

## 2015-01-04 MED ORDER — BUPIVACAINE-EPINEPHRINE (PF) 0.25% -1:200000 IJ SOLN
INTRAMUSCULAR | Status: AC
  Filled 2015-01-04: qty 30

## 2015-01-04 MED ORDER — GLYCOPYRROLATE 0.2 MG/ML IJ SOLN
INTRAMUSCULAR | Status: DC | PRN
  Administered 2015-01-04: 0.4 mg via INTRAVENOUS

## 2015-01-04 MED ORDER — NEOSTIGMINE METHYLSULFATE 10 MG/10ML IV SOLN
INTRAVENOUS | Status: AC
  Filled 2015-01-04: qty 1

## 2015-01-04 MED ORDER — NEOSTIGMINE METHYLSULFATE 10 MG/10ML IV SOLN
INTRAVENOUS | Status: DC | PRN
  Administered 2015-01-04: 3 mg via INTRAVENOUS

## 2015-01-04 MED ORDER — DEXAMETHASONE SODIUM PHOSPHATE 4 MG/ML IJ SOLN
INTRAMUSCULAR | Status: DC | PRN
  Administered 2015-01-04: 4 mg via INTRAVENOUS

## 2015-01-04 MED ORDER — GLYCOPYRROLATE 0.2 MG/ML IJ SOLN
INTRAMUSCULAR | Status: AC
  Filled 2015-01-04: qty 2

## 2015-01-04 MED ORDER — ROCURONIUM BROMIDE 100 MG/10ML IV SOLN
INTRAVENOUS | Status: DC | PRN
  Administered 2015-01-04: 50 mg via INTRAVENOUS

## 2015-01-04 MED ORDER — SODIUM CHLORIDE 0.9 % IV SOLN
INTRAVENOUS | Status: DC | PRN
  Administered 2015-01-04: 50 mL

## 2015-01-04 MED ORDER — OXYCODONE-ACETAMINOPHEN 10-325 MG PO TABS: 1.0000 | ORAL_TABLET | Freq: Four times a day (QID) | ORAL | Status: AC | PRN

## 2015-01-04 MED ORDER — HYDROMORPHONE HCL 1 MG/ML IJ SOLN
INTRAMUSCULAR | Status: AC
  Filled 2015-01-04: qty 1

## 2015-01-04 MED ORDER — BUPIVACAINE-EPINEPHRINE 0.25% -1:200000 IJ SOLN
INTRAMUSCULAR | Status: DC | PRN
  Administered 2015-01-04: 30 mL

## 2015-01-04 MED ORDER — HYDROMORPHONE HCL 1 MG/ML IJ SOLN
0.2500 mg | INTRAMUSCULAR | Status: DC | PRN
  Administered 2015-01-04: 0.75 mg via INTRAVENOUS
  Administered 2015-01-04: 0.25 mg via INTRAVENOUS

## 2015-01-04 MED ORDER — PROPOFOL 10 MG/ML IV BOLUS
INTRAVENOUS | Status: DC | PRN
  Administered 2015-01-04: 200 mg via INTRAVENOUS

## 2015-01-04 MED ORDER — ROCURONIUM BROMIDE 50 MG/5ML IV SOLN
INTRAVENOUS | Status: AC
  Filled 2015-01-04: qty 1

## 2015-01-04 MED ORDER — PHENYLEPHRINE 40 MCG/ML (10ML) SYRINGE FOR IV PUSH (FOR BLOOD PRESSURE SUPPORT)
PREFILLED_SYRINGE | INTRAVENOUS | Status: AC
  Filled 2015-01-04: qty 10

## 2015-01-04 MED ORDER — KETOROLAC TROMETHAMINE 15 MG/ML IJ SOLN
INTRAMUSCULAR | Status: AC
  Filled 2015-01-04: qty 1

## 2015-01-04 MED ORDER — FENTANYL CITRATE 0.05 MG/ML IJ SOLN
INTRAMUSCULAR | Status: AC
  Filled 2015-01-04: qty 5

## 2015-01-04 MED ORDER — SODIUM CHLORIDE 0.9 % IR SOLN
Status: DC | PRN
  Administered 2015-01-04: 1000 mL

## 2015-01-04 MED ORDER — PROPOFOL 10 MG/ML IV BOLUS
INTRAVENOUS | Status: AC
  Filled 2015-01-04: qty 20

## 2015-01-04 MED ORDER — 0.9 % SODIUM CHLORIDE (POUR BTL) OPTIME
TOPICAL | Status: DC | PRN
  Administered 2015-01-04: 1000 mL

## 2015-01-04 MED ORDER — PROMETHAZINE HCL 25 MG/ML IJ SOLN: 6.2500 mg | INTRAMUSCULAR | Status: DC | PRN

## 2015-01-04 MED ORDER — LIDOCAINE HCL (CARDIAC) 20 MG/ML IV SOLN
INTRAVENOUS | Status: DC | PRN
  Administered 2015-01-04: 60 mg via INTRAVENOUS

## 2015-01-04 MED ORDER — MIDAZOLAM HCL 2 MG/2ML IJ SOLN
INTRAMUSCULAR | Status: AC
  Filled 2015-01-04: qty 2

## 2015-01-04 MED ORDER — ONDANSETRON HCL 4 MG/2ML IJ SOLN
INTRAMUSCULAR | Status: AC
  Filled 2015-01-04: qty 2

## 2015-01-04 MED ORDER — FENTANYL CITRATE 0.05 MG/ML IJ SOLN
INTRAMUSCULAR | Status: DC | PRN
  Administered 2015-01-04: 50 ug via INTRAVENOUS
  Administered 2015-01-04: 100 ug via INTRAVENOUS
  Administered 2015-01-04: 50 ug via INTRAVENOUS

## 2015-01-04 MED ORDER — MIDAZOLAM HCL 5 MG/5ML IJ SOLN
INTRAMUSCULAR | Status: DC | PRN
  Administered 2015-01-04: 2 mg via INTRAVENOUS

## 2015-01-04 MED ORDER — PHENYLEPHRINE HCL 10 MG/ML IJ SOLN
INTRAMUSCULAR | Status: DC | PRN
  Administered 2015-01-04: 120 ug via INTRAVENOUS

## 2015-01-04 MED ORDER — ONDANSETRON HCL 4 MG/2ML IJ SOLN
INTRAMUSCULAR | Status: DC | PRN
  Administered 2015-01-04: 4 mg via INTRAVENOUS

## 2015-01-04 MED ORDER — LACTATED RINGERS IV SOLN
INTRAVENOUS | Status: DC | PRN
  Administered 2015-01-04: 07:00:00 via INTRAVENOUS

## 2015-01-04 MED ORDER — KETOROLAC TROMETHAMINE 15 MG/ML IJ SOLN
15.0000 mg | Freq: Four times a day (QID) | INTRAMUSCULAR | Status: DC | PRN
  Administered 2015-01-04: 15 mg via INTRAVENOUS

## 2015-01-04 MED ORDER — ARTIFICIAL TEARS OP OINT
TOPICAL_OINTMENT | OPHTHALMIC | Status: AC
  Filled 2015-01-04: qty 3.5

## 2015-01-04 SURGICAL SUPPLY — 40 items
APPLIER CLIP 5 13 M/L LIGAMAX5 (MISCELLANEOUS) ×3
APR CLP MED LRG 5 ANG JAW (MISCELLANEOUS) ×1
BAG SPEC RTRVL 10 TROC 200 (ENDOMECHANICALS) ×1
CANISTER SUCTION 2500CC (MISCELLANEOUS) ×3 IMPLANT
CHLORAPREP W/TINT 26ML (MISCELLANEOUS) ×3 IMPLANT
CLIP APPLIE 5 13 M/L LIGAMAX5 (MISCELLANEOUS) ×1 IMPLANT
COVER MAYO STAND STRL (DRAPES) ×3 IMPLANT
COVER SURGICAL LIGHT HANDLE (MISCELLANEOUS) ×3 IMPLANT
DRAPE C-ARM 42X72 X-RAY (DRAPES) ×3 IMPLANT
DRAPE LAPAROSCOPIC ABDOMINAL (DRAPES) ×3 IMPLANT
ELECT REM PT RETURN 9FT ADLT (ELECTROSURGICAL) ×3
ELECTRODE REM PT RTRN 9FT ADLT (ELECTROSURGICAL) ×1 IMPLANT
GLOVE BIO SURGEON STRL SZ7 (GLOVE) ×5 IMPLANT
GLOVE BIOGEL PI IND STRL 7.0 (GLOVE) IMPLANT
GLOVE BIOGEL PI IND STRL 7.5 (GLOVE) ×1 IMPLANT
GLOVE BIOGEL PI INDICATOR 7.0 (GLOVE) ×4
GLOVE BIOGEL PI INDICATOR 7.5 (GLOVE) ×2
GLOVE SURG SS PI 7.0 STRL IVOR (GLOVE) ×6 IMPLANT
GOWN STRL REUS W/ TWL LRG LVL3 (GOWN DISPOSABLE) ×3 IMPLANT
GOWN STRL REUS W/TWL LRG LVL3 (GOWN DISPOSABLE) ×9
HEMOSTAT SNOW SURGICEL 2X4 (HEMOSTASIS) ×2 IMPLANT
KIT BASIN OR (CUSTOM PROCEDURE TRAY) ×3 IMPLANT
KIT ROOM TURNOVER OR (KITS) ×3 IMPLANT
LIQUID BAND (GAUZE/BANDAGES/DRESSINGS) ×3 IMPLANT
NS IRRIG 1000ML POUR BTL (IV SOLUTION) ×3 IMPLANT
PAD ARMBOARD 7.5X6 YLW CONV (MISCELLANEOUS) ×3 IMPLANT
POUCH RETRIEVAL ECOSAC 10 (ENDOMECHANICALS) ×1 IMPLANT
POUCH RETRIEVAL ECOSAC 10MM (ENDOMECHANICALS) ×2
SCISSORS LAP 5X35 DISP (ENDOMECHANICALS) ×3 IMPLANT
SET CHOLANGIOGRAPH 5 50 .035 (SET/KITS/TRAYS/PACK) ×3 IMPLANT
SET IRRIG TUBING LAPAROSCOPIC (IRRIGATION / IRRIGATOR) ×3 IMPLANT
SLEEVE ENDOPATH XCEL 5M (ENDOMECHANICALS) ×6 IMPLANT
SPECIMEN JAR SMALL (MISCELLANEOUS) ×3 IMPLANT
SUT MNCRL AB 4-0 PS2 18 (SUTURE) ×3 IMPLANT
TOWEL OR 17X24 6PK STRL BLUE (TOWEL DISPOSABLE) ×3 IMPLANT
TOWEL OR 17X26 10 PK STRL BLUE (TOWEL DISPOSABLE) ×3 IMPLANT
TRAY LAPAROSCOPIC (CUSTOM PROCEDURE TRAY) ×3 IMPLANT
TROCAR XCEL BLUNT TIP 100MML (ENDOMECHANICALS) ×3 IMPLANT
TROCAR XCEL NON-BLD 5MMX100MML (ENDOMECHANICALS) ×3 IMPLANT
TUBING INSUFFLATION (TUBING) ×3 IMPLANT

## 2015-01-04 NOTE — Op Note (Signed)
Preoperative diagnosis: biliary colic, history gallstone pancreatitis Postoperative diagnosis: same as above Procedure: Laparoscopic cholecystectomy with cholangiogram Surgeon: Dr. Serita Grammes Anesthesia: Gen. Estimated blood loss: Minimal Complications: None Drains: None Specimens: Gallbladder and contents to pathology Sponge count correct at completion Description to recovery stable  Indications: This a 23 yof with biliary colic who now has had episode of gallstone pancreatitis. I discussed laparoscopic cholecystectomy along with risks and benefits.   Procedure: After informed consent was obtained she was taken to the operating room. She was given cefoxitin. Sequential compression devices were on her legs. She was placed under general anesthesia without complication. Her abdomen was prepped and draped in the standard sterile surgical fashion. A surgical timeout was then performed.  I infiltrated marcaine below her umbilicus. I made a vertical incision and entered into the peritoneum bluntly. A 0 vicryl pursestring was placed and a hasson trocar were introduced.The abdomen was insufflated to 15 mm Hg pressure.. I then placed 3 further 5 mm trocars in the epigastrium and right upper quadrant under direct vision without complication. I then was able to grasp the gallbladder.  I was able to dissect the ductal structures. I identified the critical view of safety. I clipped and divided the cystic artery. I then clipped the duct distally. I made a ductotomy and introduced a cook catheter. This was secured. I did a cholangiogram and initially only filled the distally. There was filling of duodenum without any stones visible. I dropped the head of the bed and performed another cholangiogram. This time I filled both side of the liver proximally confirming position in the cystic duct.  I then removed the catheter and clipped the duct.  I divided this. These clips completely traversed the duct and the  duct was viable. I then removed the gallbladder from the liver bed without difficulty.  This was then placed in a bag. This was eventually removed from the umbilicus. Hemostasis was observed. I removed my hasson trocar and closed my pursestring suture.  After I closed this I then removed the trocars and desufflated the abdomen. I then closed the skin with 4-0 Monocryl and glue. Steri-Strips were placed over this. She tolerated this was exttubated and transferred to the recovery room in stable condition.

## 2015-01-04 NOTE — Anesthesia Procedure Notes (Addendum)
Procedure Name: Intubation Date/Time: 01/04/2015 7:34 AM Performed by: Kyung Rudd Pre-anesthesia Checklist: Patient identified, Emergency Drugs available, Patient being monitored and Timeout performed Patient Re-evaluated:Patient Re-evaluated prior to inductionOxygen Delivery Method: Circle system utilized Preoxygenation: Pre-oxygenation with 100% oxygen Intubation Type: IV induction Ventilation: Mask ventilation without difficulty Tube type: Oral Tube size: 7.0 mm Number of attempts: 2 Airway Equipment and Method: Video-laryngoscopy Placement Confirmation: ETT inserted through vocal cords under direct vision,  positive ETCO2 and breath sounds checked- equal and bilateral Secured at: 21 cm Tube secured with: Tape Dental Injury: Teeth and Oropharynx as per pre-operative assessment and Injury to lip  Comments: DL x1 with MAC4, Grade IV view per CRNA. 2nd DL with Glidescope per MDA. Grade I view on screen, ETT passed thru VC. +ETCO2 & BBS=. Bottom lip pinched with glidescope blade.

## 2015-01-04 NOTE — Interval H&P Note (Signed)
History and Physical Interval Note:  01/04/2015 6:58 AM  Maria Huber  has presented today for surgery, with the diagnosis of SYMPTOMATIC CHOLELITHIASIS  The various methods of treatment have been discussed with the patient and family. After consideration of risks, benefits and other options for treatment, the patient has consented to  Procedure(s): LAPAROSCOPIC CHOLECYSTECTOMY WITH INTRAOPERATIVE CHOLANGIOGRAM (N/A) as a surgical intervention .  The patient's history has been reviewed, patient examined, no change in status, stable for surgery.  I have reviewed the patient's chart and labs.  Questions were answered to the patient's satisfaction.     [name]

## 2015-01-04 NOTE — H&P (Signed)
This is a 64 year old female who I saw last December with symptomatic cholelithiasis. I recommended lap chole at that time. Since then she has had several episodes of minor ruq pain until 12/19 when she had what sounds like mild episode of GSP. She was seen in [facility] ER with lipase of 150 and epigastric/chest pain.I have seen [place] and report. She still has multiple echogenic gallstones within lumen. She had sonographic murphys sign then. She has recovered from this and returns today to discuss options.   Other Problems Marjean Donna, CMA; 12/30/2014 9:48 AM) Anxiety Disorder Back Pain Cholelithiasis Depression Hemorrhoids Lump In Breast  Past Surgical History Marjean Donna, CMA; 12/30/2014 9:48 AM) Breast Biopsy multiple Colon Polyp Removal - Colonoscopy Mammoplasty; Reduction Bilateral. Shoulder Surgery Left. Tonsillectomy  Diagnostic Studies History Marjean Donna, CMA; 12/30/2014 9:48 AM) Colonoscopy 1-5 years ago Mammogram 1-3 years ago Pap Smear 1-5 years ago  Allergies Marjean Donna, CMA; 12/30/2014 9:46 AM) Hydrocodone-Acetaminophen *ANALGESICS - OPIOID* Famvir *ANTIVIRALS*  Medication History (Sonya Bynum, CMA; 12/30/2014 9:47 AM) Vitamin D (1000UNIT Tablet, Oral) Active. Vitamin B Complex-C (Oral) Active. Multivitamins (Oral) Active.  Social History ([place]; 12/30/2014 9:48 AM) Alcohol use Occasional alcohol use. No caffeine use No drug use Tobacco use Former smoker.  Family History Marjean Donna, [place]; 12/30/2014 9:48 AM) Cancer Father. Colon Cancer Family Members In General. Diabetes Mellitus Family Members In General. Heart Disease Mother. Heart disease in female family member before age 29 Malignant Neoplasm Of Pancreas Family Members In General. Respiratory Condition Sister.  Pregnancy / Birth History Marjean Donna, [place]; 12/30/2014 9:48 AM) Age at menarche 51 years. Age of menopause 51-55 Contraceptive History Oral  contraceptives. Gravida 1 Maternal age 50-25 Para 1 Regular periods  Review of Systems Davy Pique Bynum CMA; 12/30/2014 9:48 AM) General Not Present- Appetite Loss, Chills, Fatigue, Fever, Night Sweats, Weight Gain and Weight Loss. Skin Present- Dryness, Jaundice and Rash. Not Present- Change in Wart/Mole, Hives, New Lesions, Non-Healing Wounds and Ulcer. HEENT Present- Hearing Loss, Ringing in the Ears and Wears glasses/contact lenses. Not Present- Earache, Hoarseness, Nose Bleed, Oral Ulcers, Seasonal Allergies, Sinus Pain, Sore Throat, Visual Disturbances and Yellow Eyes. Respiratory Not Present- Bloody sputum, Chronic Cough, Difficulty Breathing, Snoring and Wheezing. Breast Not Present- Breast Mass, Breast Pain, Nipple Discharge and Skin Changes. Cardiovascular Not Present- Chest Pain, Difficulty Breathing Lying Down, Leg Cramps, Palpitations, Rapid Heart Rate, Shortness of Breath and Swelling of Extremities. Gastrointestinal Present- Abdominal Pain, Bloating, Constipation and Hemorrhoids. Not Present- Bloody Stool, Change in Bowel Habits, Chronic diarrhea, Difficulty Swallowing, Excessive gas, Gets full quickly at meals, Indigestion, Nausea, Rectal Pain and Vomiting. Female Genitourinary Not Present- Frequency, Nocturia, Painful Urination, Pelvic Pain and Urgency. Musculoskeletal Present- Back Pain. Not Present- Joint Pain, Joint Stiffness, Muscle Pain, Muscle Weakness and Swelling of Extremities. Neurological Not Present- Decreased Memory, Fainting, Headaches, Numbness, Seizures, Tingling, Tremor, Trouble walking and Weakness. Psychiatric Present- Anxiety. Not Present- Bipolar, Change in Sleep Pattern, Depression, Fearful and Frequent crying. Endocrine Not Present- Cold Intolerance, Excessive Hunger, Hair Changes, Heat Intolerance, Hot flashes and New Diabetes. Hematology Not Present- Easy Bruising, Excessive bleeding, Gland problems, HIV and Persistent Infections.   Vitals (Sonya Bynum  CMA; 12/30/2014 9:47 AM) 12/30/2014 9:47 AM Weight: 176 lb Height: 67in Body Surface Area: 1.94 m Body Mass Index: 27.57 kg/m Pulse: 110 (Regular)  BP: 124/74 (Sitting, Left Arm, Standard)    Physical Exam Rolm Bookbinder MD; 12/30/2014 10:20 AM) General Mental Status-Alert. Orientation-Oriented X3.  Chest and Lung Exam Chest and lung  exam reveals -on auscultation, normal breath sounds, no adventitious sounds and normal vocal resonance.  Cardiovascular Cardiovascular examination reveals -normal heart sounds, regular rate and rhythm with no murmurs.  Abdomen Note: mild ruq pain to palpation, no murphys sign, healed infraumbilical scar, soft, nondistended     Assessment & Plan Rolm Bookbinder MD; 12/30/2014 10:19 AM) Cholelithiasis Impression: she has now had complication of her gallstones and I very strongly recommended lap chole with cholangiogram. She is agreeable now. I discussed the procedure in detail with she and her husband. I do think she has symptomatic stones and recommended lap chole. We discussed at length indications and options. She is appropriately concerned about long term effects. The patient was given Neurosurgeon. We discussed the risks and benefits of a laparoscopic cholecystectomy and possible cholangiogram including, but not limited to bleeding, infection, injury to surrounding structures such as the intestine or liver, bile leak, retained gallstones, need to convert to an open procedure, prolonged diarrhea, blood clots such as DVT, common bile duct injury, anesthesia risks, and possible need for additional procedures. The likelihood of improvement in symptoms and return to the patient's normal status is good. We discussed the typical post-operative recovery course.

## 2015-01-04 NOTE — Anesthesia Postprocedure Evaluation (Signed)
  Anesthesia Post-op Note  Patient: Maria Huber  Procedure(s) Performed: Procedure(s): LAPAROSCOPIC CHOLECYSTECTOMY WITH INTRAOPERATIVE CHOLANGIOGRAM (N/A)  Patient Location: PACU  Anesthesia Type:General  Level of Consciousness: awake and alert   Airway and Oxygen Therapy: Patient Spontanous Breathing  Post-op Pain: mild  Post-op Assessment: Post-op Vital signs reviewed  Post-op Vital Signs: stable  Last Vitals:  Filed Vitals:   01/04/15 1109  BP:   Pulse: 64  Temp:   Resp: 15    Complications: No apparent anesthesia complications

## 2015-01-04 NOTE — Anesthesia Preprocedure Evaluation (Addendum)
Anesthesia Evaluation  Patient identified by MRN, date of birth, ID band Patient awake    Reviewed: Allergy & Precautions, NPO status , Patient's Chart, lab work & pertinent test results  History of Anesthesia Complications (+) PONV  Airway Mallampati: I       Dental  (+) Teeth Intact   Pulmonary former smoker,  breath sounds clear to auscultation        Cardiovascular negative cardio ROS  Rhythm:Regular Rate:Normal     Neuro/Psych  Neuromuscular disease    GI/Hepatic negative GI ROS, Neg liver ROS,   Endo/Other    Renal/GU negative Renal ROS     Musculoskeletal  (+) Fibromyalgia -  Abdominal   Peds  Hematology   Anesthesia Other Findings   Reproductive/Obstetrics                            Anesthesia Physical Anesthesia Plan  ASA: II  Anesthesia Plan: General   Post-op Pain Management:    Induction: Intravenous  Airway Management Planned: Oral ETT  Additional Equipment:   Intra-op Plan:   Post-operative Plan: Extubation in OR  Informed Consent: I have reviewed the patients History and Physical, chart, labs and discussed the procedure including the risks, benefits and alternatives for the proposed anesthesia with the patient or authorized representative who has indicated his/her understanding and acceptance.   Dental advisory given  Plan Discussed with: CRNA and Surgeon  Anesthesia Plan Comments:         Anesthesia Quick Evaluation

## 2015-01-04 NOTE — Discharge Instructions (Signed)
[facility] LAPAROSCOPIC SURGERY: POST OP INSTRUCTIONS  Always review your discharge instruction sheet given to you by the facility where your surgery was performed. IF YOU HAVE DISABILITY OR FAMILY LEAVE FORMS, YOU MUST BRING THEM TO THE OFFICE FOR PROCESSING.   DO NOT GIVE THEM TO YOUR DOCTOR.  1. A prescription for pain medication may be given to you upon discharge.  Take your pain medication as prescribed, if needed.  If narcotic pain medicine is not needed, then you may take acetaminophen (Tylenol), naprosyn (Alleve), or ibuprofen (Advil) as needed. 2. Take your usually prescribed medications unless otherwise directed. 3. If you need a refill on your pain medication, please contact your pharmacy.  They will contact our office to request authorization. Prescriptions will not be filled after 5pm or on week-ends. 4. You should follow a light diet the first few days after arrival home, such as soup and crackers, etc.  Be sure to include lots of fluids daily. 5. Most patients will experience some swelling and bruising in the area of the incisions.  Ice packs will help.  Swelling and bruising can take several days to resolve.  6. It is common to experience some constipation if taking pain medication after surgery.  Increasing fluid intake and taking a stool softener (such as Colace) will usually help or prevent this problem from occurring.  A mild laxative (Milk of Magnesia or Miralax) should be taken according to package instructions if there are no bowel movements after 48 hours. 7. Unless discharge instructions indicate otherwise, you may remove your bandages 48 hours after surgery, and you may shower at that time.  You may have steri-strips (small skin tapes) in place directly over the incision.  These strips should be left on the skin for 7-10 days.  If your surgeon used skin glue on the incision, you may shower in 24 hours.  The glue will flake  off over the next 2-3 weeks.  Any sutures or staples will be removed at the office during your follow-up visit. 8. ACTIVITIES:  You may resume regular (light) daily activities beginning the next day--such as daily self-care, walking, climbing stairs--gradually increasing activities as tolerated.  You may have sexual intercourse when it is comfortable.  Refrain from any heavy lifting or straining until approved by your doctor. a. You may drive when you are no longer taking prescription pain medication, you can comfortably wear a seatbelt, and you can safely maneuver your car and apply brakes. b. RETURN TO WORK:  __________________________________________________________ 9. You should see your doctor in the office for a follow-up appointment approximately 2-3 weeks after your surgery.  Make sure that you call for this appointment within a day or two after you arrive home to insure a convenient appointment time. 10. OTHER INSTRUCTIONS: __________________________________________________________________________________________________________________________ __________________________________________________________________________________________________________________________ WHEN TO CALL YOUR DOCTOR: 1. Fever over 101.0 2. Inability to urinate 3. Continued bleeding from incision. 4. Increased pain, redness, or drainage from the incision. 5. Increasing abdominal pain  The clinic staff is available to answer your questions during regular business hours.  Please dont hesitate to call and ask to speak to one of the nurses for clinical concerns.  If you have a medical emergency, go to the nearest emergency room or call 911.  A surgeon from [facility] is always on call at the hospital. [address], [place], [place], [place]  [address] ? [address], [place], [place]   [address] [phone] ? [phone] ? FAX [phone] Web site: [email or web address]  General  Anesthesia, Adult, Care After  Refer to this sheet in the next few weeks. These instructions provide you with information on caring for yourself after your procedure. Your health care provider may also give you more specific instructions. Your treatment has been planned according to current medical practices, but problems sometimes occur. Call your health care provider if you have any problems or questions after your procedure.  WHAT TO EXPECT AFTER THE PROCEDURE  After the procedure, it is typical to experience:  Sleepiness.  Nausea and vomiting. HOME CARE INSTRUCTIONS  For the first 24 hours after general anesthesia:  Have a responsible person with you.  Do not drive a car. If you are alone, do not take public transportation.  Do not drink alcohol.  Do not take medicine that has not been prescribed by your health care provider.  Do not sign important papers or make important decisions.  You may resume a normal diet and activities as directed by your health care provider.  Change bandages (dressings) as directed.  If you have questions or problems that seem related to general anesthesia, call the hospital and ask for the anesthetist or anesthesiologist on call. SEEK MEDICAL CARE IF:  You have nausea and vomiting that continue the day after anesthesia.  You develop a rash. SEEK IMMEDIATE MEDICAL CARE IF:  You have difficulty breathing.  You have chest pain.  You have any allergic problems. Document Released: 03/18/2001 Document Revised: 08/12/2013 Document Reviewed: 06/25/2013  [facility] Patient Information 2014 [place], [place].

## 2015-01-04 NOTE — Transfer of Care (Signed)
Immediate Anesthesia Transfer of Care Note  Patient: Maria Huber  Procedure(s) Performed: Procedure(s): LAPAROSCOPIC CHOLECYSTECTOMY WITH INTRAOPERATIVE CHOLANGIOGRAM (N/A)  Patient Location: PACU  Anesthesia Type:General  Level of Consciousness: sedated  Airway & Oxygen Therapy: Patient Spontanous Breathing and Patient connected to nasal cannula oxygen  Post-op Assessment: Report given to PACU RN, Post -op Vital signs reviewed and stable and Patient moving all extremities X 4  Post vital signs: Reviewed and stable  Complications: No apparent anesthesia complications

## 2015-01-05 ENCOUNTER — Encounter (HOSPITAL_COMMUNITY): Payer: Self-pay | Admitting: General Surgery

## 2015-01-07 ENCOUNTER — Encounter: Payer: Self-pay | Admitting: Physician Assistant

## 2015-01-26 DIAGNOSIS — Z6834 Body mass index (BMI) 34.0-34.9, adult: Secondary | ICD-10-CM | POA: Diagnosis not present

## 2015-01-26 DIAGNOSIS — M545 Low back pain: Secondary | ICD-10-CM | POA: Diagnosis not present

## 2015-02-07 DIAGNOSIS — H9193 Unspecified hearing loss, bilateral: Secondary | ICD-10-CM | POA: Diagnosis not present

## 2015-03-08 ENCOUNTER — Telehealth: Payer: Self-pay | Admitting: Physician Assistant

## 2015-03-08 NOTE — Telephone Encounter (Signed)
Do not given steroids without evaluation in clinic.  These packs are not indicated for chronic pain.  Would still recommend injection with Neurosurgeon for more chronic pain management.

## 2015-03-08 NOTE — Telephone Encounter (Signed)
Caller name:Benassi, Jaylenne Relation to ZO:XWRU Call back number:[phone] Pharmacy:[facility] [address]  Reason for call: pt states she saw Einar Pheasant 12/07/14, and he had given her rx methylPREDNIsolone (MEDROL DOSPACK) 4 MG tablet , pt states Cody referred her to have a MRI done, and the doctor states he does not want to do surgery right now wants to know if she can get a rx for the [place] again verses getting injections in her back.

## 2015-03-09 NOTE — Telephone Encounter (Signed)
Patient informed, understood & agreed/SLS  

## 2015-04-27 DIAGNOSIS — L820 Inflamed seborrheic keratosis: Secondary | ICD-10-CM | POA: Diagnosis not present

## 2015-04-27 DIAGNOSIS — M67911 Unspecified disorder of synovium and tendon, right shoulder: Secondary | ICD-10-CM | POA: Diagnosis not present

## 2015-04-27 DIAGNOSIS — L918 Other hypertrophic disorders of the skin: Secondary | ICD-10-CM | POA: Diagnosis not present

## 2015-05-04 DIAGNOSIS — M25511 Pain in right shoulder: Secondary | ICD-10-CM | POA: Diagnosis not present

## 2015-05-06 DIAGNOSIS — M67911 Unspecified disorder of synovium and tendon, right shoulder: Secondary | ICD-10-CM | POA: Diagnosis not present

## 2015-05-06 DIAGNOSIS — M24811 Other specific joint derangements of right shoulder, not elsewhere classified: Secondary | ICD-10-CM | POA: Diagnosis not present

## 2015-08-07 DIAGNOSIS — K5732 Diverticulitis of large intestine without perforation or abscess without bleeding: Secondary | ICD-10-CM | POA: Diagnosis present

## 2015-08-07 DIAGNOSIS — A419 Sepsis, unspecified organism: Secondary | ICD-10-CM | POA: Diagnosis not present

## 2015-08-07 DIAGNOSIS — K5733 Diverticulitis of large intestine without perforation or abscess with bleeding: Secondary | ICD-10-CM | POA: Diagnosis not present

## 2015-08-07 DIAGNOSIS — E860 Dehydration: Secondary | ICD-10-CM | POA: Diagnosis not present

## 2015-08-07 DIAGNOSIS — Z87891 Personal history of nicotine dependence: Secondary | ICD-10-CM | POA: Diagnosis not present

## 2015-08-07 DIAGNOSIS — R1030 Lower abdominal pain, unspecified: Secondary | ICD-10-CM | POA: Diagnosis not present

## 2015-08-16 DIAGNOSIS — Z09 Encounter for follow-up examination after completed treatment for conditions other than malignant neoplasm: Secondary | ICD-10-CM | POA: Diagnosis not present

## 2015-08-16 DIAGNOSIS — R899 Unspecified abnormal finding in specimens from other organs, systems and tissues: Secondary | ICD-10-CM | POA: Diagnosis not present

## 2015-08-16 DIAGNOSIS — K5732 Diverticulitis of large intestine without perforation or abscess without bleeding: Secondary | ICD-10-CM | POA: Diagnosis not present

## 2015-10-05 DIAGNOSIS — M24811 Other specific joint derangements of right shoulder, not elsewhere classified: Secondary | ICD-10-CM | POA: Diagnosis not present

## 2015-10-05 DIAGNOSIS — M75111 Incomplete rotator cuff tear or rupture of right shoulder, not specified as traumatic: Secondary | ICD-10-CM | POA: Diagnosis not present

## 2015-10-25 DIAGNOSIS — M7551 Bursitis of right shoulder: Secondary | ICD-10-CM | POA: Diagnosis not present

## 2015-10-25 DIAGNOSIS — G8918 Other acute postprocedural pain: Secondary | ICD-10-CM | POA: Diagnosis not present

## 2015-10-25 DIAGNOSIS — M659 Synovitis and tenosynovitis, unspecified: Secondary | ICD-10-CM | POA: Diagnosis not present

## 2015-10-25 DIAGNOSIS — M75111 Incomplete rotator cuff tear or rupture of right shoulder, not specified as traumatic: Secondary | ICD-10-CM | POA: Diagnosis not present

## 2015-10-25 DIAGNOSIS — M19011 Primary osteoarthritis, right shoulder: Secondary | ICD-10-CM | POA: Diagnosis not present

## 2015-10-25 DIAGNOSIS — M7541 Impingement syndrome of right shoulder: Secondary | ICD-10-CM | POA: Diagnosis not present

## 2015-10-25 DIAGNOSIS — S46011A Strain of muscle(s) and tendon(s) of the rotator cuff of right shoulder, initial encounter: Secondary | ICD-10-CM | POA: Diagnosis not present

## 2015-11-02 DIAGNOSIS — M19011 Primary osteoarthritis, right shoulder: Secondary | ICD-10-CM | POA: Diagnosis not present

## 2015-11-09 ENCOUNTER — Encounter: Payer: Self-pay | Admitting: Physical Therapy

## 2015-11-09 ENCOUNTER — Ambulatory Visit: Payer: Medicare Other | Attending: Orthopedic Surgery | Admitting: Physical Therapy

## 2015-11-09 DIAGNOSIS — M25511 Pain in right shoulder: Secondary | ICD-10-CM

## 2015-11-09 DIAGNOSIS — R29898 Other symptoms and signs involving the musculoskeletal system: Secondary | ICD-10-CM | POA: Diagnosis not present

## 2015-11-09 DIAGNOSIS — M25611 Stiffness of right shoulder, not elsewhere classified: Secondary | ICD-10-CM | POA: Diagnosis not present
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
## 2015-11-21 ENCOUNTER — Ambulatory Visit: Payer: Medicare Other | Admitting: Rehabilitation

## 2015-11-21 DIAGNOSIS — M25511 Pain in right shoulder: Secondary | ICD-10-CM | POA: Diagnosis not present

## 2015-11-21 DIAGNOSIS — M25611 Stiffness of right shoulder, not elsewhere classified: Secondary | ICD-10-CM | POA: Diagnosis not present

## 2015-11-21 DIAGNOSIS — R29898 Other symptoms and signs involving the musculoskeletal system: Secondary | ICD-10-CM | POA: Diagnosis not present
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
## 2015-11-24 ENCOUNTER — Ambulatory Visit: Payer: Medicare Other | Attending: Orthopedic Surgery | Admitting: Physical Therapy

## 2015-11-24 DIAGNOSIS — R29898 Other symptoms and signs involving the musculoskeletal system: Secondary | ICD-10-CM | POA: Diagnosis not present

## 2015-11-24 DIAGNOSIS — M25611 Stiffness of right shoulder, not elsewhere classified: Secondary | ICD-10-CM | POA: Diagnosis not present

## 2015-11-24 DIAGNOSIS — M25511 Pain in right shoulder: Secondary | ICD-10-CM
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
## 2015-11-28 ENCOUNTER — Ambulatory Visit: Payer: Medicare Other | Admitting: Rehabilitation

## 2015-11-28 ENCOUNTER — Encounter: Payer: Self-pay | Admitting: Rehabilitation

## 2015-11-28 DIAGNOSIS — M25611 Stiffness of right shoulder, not elsewhere classified: Secondary | ICD-10-CM

## 2015-11-28 DIAGNOSIS — M25511 Pain in right shoulder: Secondary | ICD-10-CM | POA: Diagnosis not present

## 2015-11-28 DIAGNOSIS — R29898 Other symptoms and signs involving the musculoskeletal system: Secondary | ICD-10-CM
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
## 2015-12-01 ENCOUNTER — Ambulatory Visit: Payer: Medicare Other | Admitting: Physical Therapy

## 2015-12-01 DIAGNOSIS — M25511 Pain in right shoulder: Secondary | ICD-10-CM

## 2015-12-01 DIAGNOSIS — M25611 Stiffness of right shoulder, not elsewhere classified: Secondary | ICD-10-CM

## 2015-12-01 DIAGNOSIS — R29898 Other symptoms and signs involving the musculoskeletal system: Secondary | ICD-10-CM | POA: Diagnosis not present
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
## 2015-12-05 ENCOUNTER — Ambulatory Visit: Payer: Medicare Other | Admitting: Rehabilitation

## 2015-12-05 DIAGNOSIS — M25611 Stiffness of right shoulder, not elsewhere classified: Secondary | ICD-10-CM | POA: Diagnosis not present

## 2015-12-05 DIAGNOSIS — R29898 Other symptoms and signs involving the musculoskeletal system: Secondary | ICD-10-CM

## 2015-12-05 DIAGNOSIS — M25511 Pain in right shoulder: Secondary | ICD-10-CM | POA: Diagnosis not present
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
## 2015-12-08 ENCOUNTER — Ambulatory Visit: Payer: Medicare Other | Admitting: Physical Therapy

## 2015-12-08 DIAGNOSIS — M25611 Stiffness of right shoulder, not elsewhere classified: Secondary | ICD-10-CM | POA: Diagnosis not present

## 2015-12-08 DIAGNOSIS — R29898 Other symptoms and signs involving the musculoskeletal system: Secondary | ICD-10-CM | POA: Diagnosis not present

## 2015-12-08 DIAGNOSIS — M25511 Pain in right shoulder: Secondary | ICD-10-CM | POA: Diagnosis not present
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
## 2015-12-12 ENCOUNTER — Ambulatory Visit: Payer: Medicare Other | Admitting: Rehabilitation

## 2015-12-12 ENCOUNTER — Encounter: Payer: Self-pay | Admitting: Rehabilitation

## 2015-12-12 DIAGNOSIS — M25511 Pain in right shoulder: Secondary | ICD-10-CM

## 2015-12-12 DIAGNOSIS — R29898 Other symptoms and signs involving the musculoskeletal system: Secondary | ICD-10-CM | POA: Diagnosis not present

## 2015-12-12 DIAGNOSIS — M25611 Stiffness of right shoulder, not elsewhere classified: Secondary | ICD-10-CM | POA: Diagnosis not present
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
## 2015-12-14 ENCOUNTER — Ambulatory Visit: Payer: Medicare Other | Admitting: Physical Therapy

## 2015-12-14 DIAGNOSIS — M25511 Pain in right shoulder: Secondary | ICD-10-CM | POA: Diagnosis not present

## 2015-12-14 DIAGNOSIS — R29898 Other symptoms and signs involving the musculoskeletal system: Secondary | ICD-10-CM

## 2015-12-14 DIAGNOSIS — M25611 Stiffness of right shoulder, not elsewhere classified: Secondary | ICD-10-CM | POA: Diagnosis not present
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
## 2015-12-15 ENCOUNTER — Ambulatory Visit: Payer: Medicare Other | Admitting: Physical Therapy

## 2015-12-20 ENCOUNTER — Ambulatory Visit: Payer: Medicare Other | Admitting: Physical Therapy

## 2015-12-22 ENCOUNTER — Ambulatory Visit: Payer: Medicare Other | Admitting: Physical Therapy

## 2015-12-22 DIAGNOSIS — M25611 Stiffness of right shoulder, not elsewhere classified: Secondary | ICD-10-CM

## 2015-12-22 DIAGNOSIS — M25511 Pain in right shoulder: Secondary | ICD-10-CM

## 2015-12-22 DIAGNOSIS — R29898 Other symptoms and signs involving the musculoskeletal system: Secondary | ICD-10-CM
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
## 2015-12-30 ENCOUNTER — Ambulatory Visit: Payer: Medicare Other | Attending: Orthopedic Surgery | Admitting: Physical Therapy

## 2015-12-30 DIAGNOSIS — M25611 Stiffness of right shoulder, not elsewhere classified: Secondary | ICD-10-CM | POA: Diagnosis not present

## 2015-12-30 DIAGNOSIS — M25511 Pain in right shoulder: Secondary | ICD-10-CM | POA: Insufficient documentation

## 2015-12-30 DIAGNOSIS — R29898 Other symptoms and signs involving the musculoskeletal system: Secondary | ICD-10-CM | POA: Diagnosis not present
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
## 2016-01-01 IMAGING — DX DG LUMBAR SPINE COMPLETE 4+V
5 series · 5 of 5 positions shown · non-contrast
Comparison: None.

CLINICAL DATA: 63-year-old female with low back pain for 5 days
with no known injury. Pain radiating to both lower extremities.
Initial encounter.

EXAM:
LUMBAR SPINE - COMPLETE 4+ VIEW

[l-spine ap]
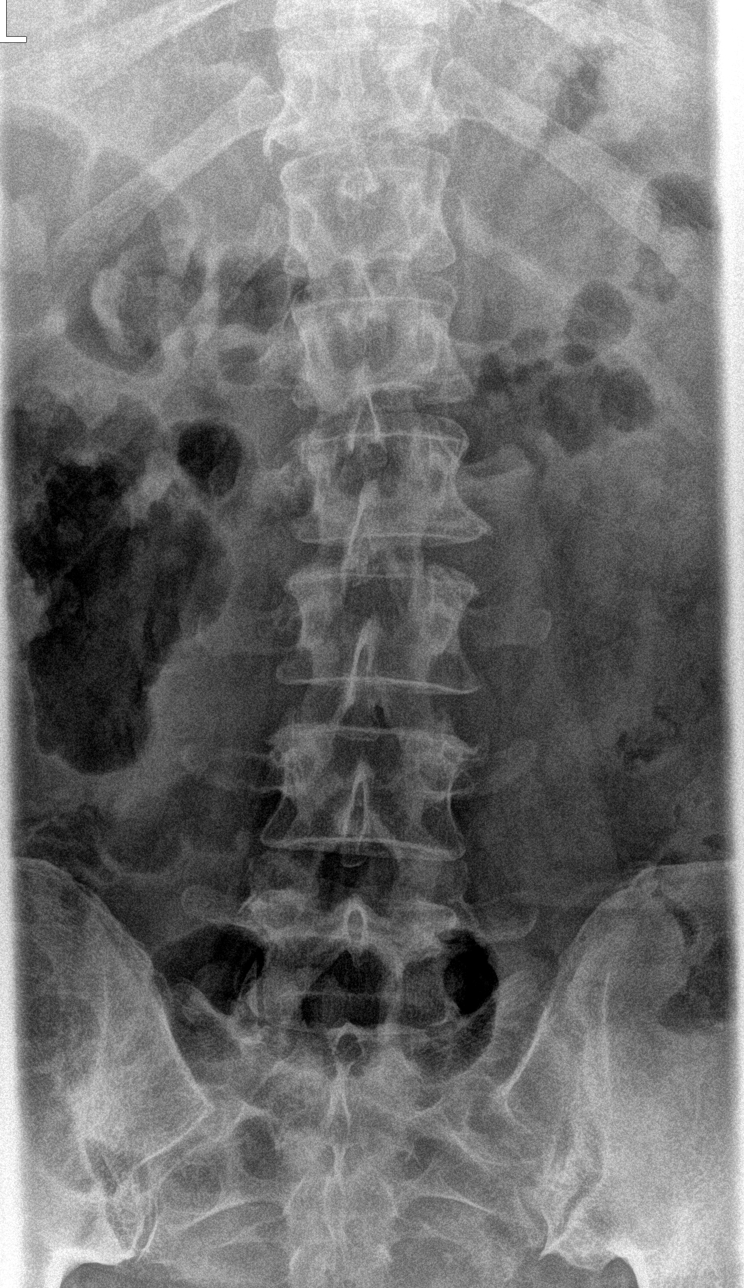

[l-spine obl (1 of 2)]
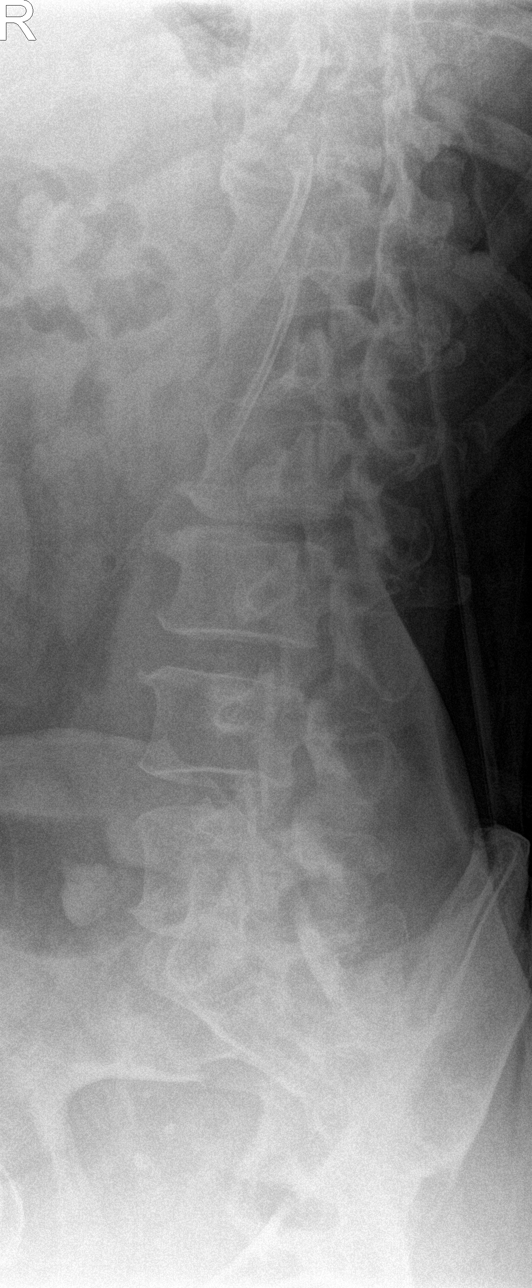

[l-spine obl (2 of 2)]
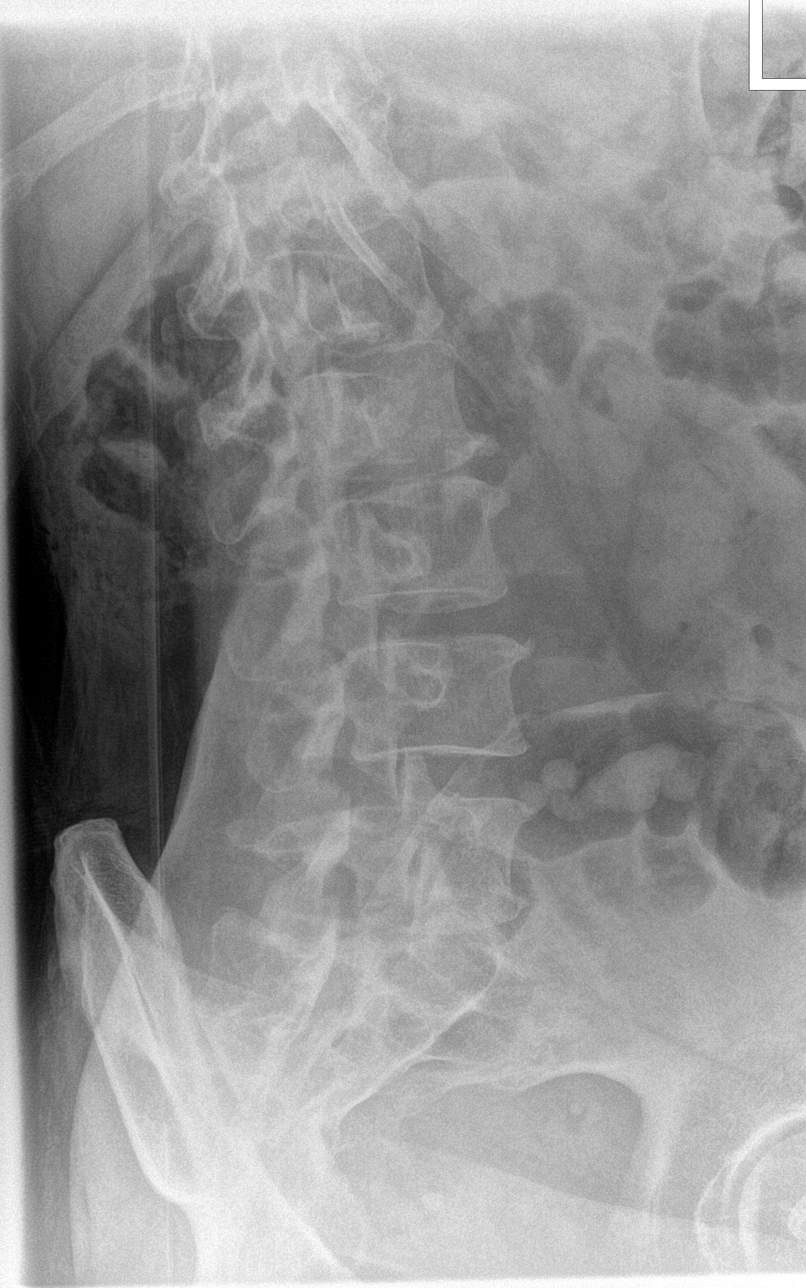

[l-spine lat]
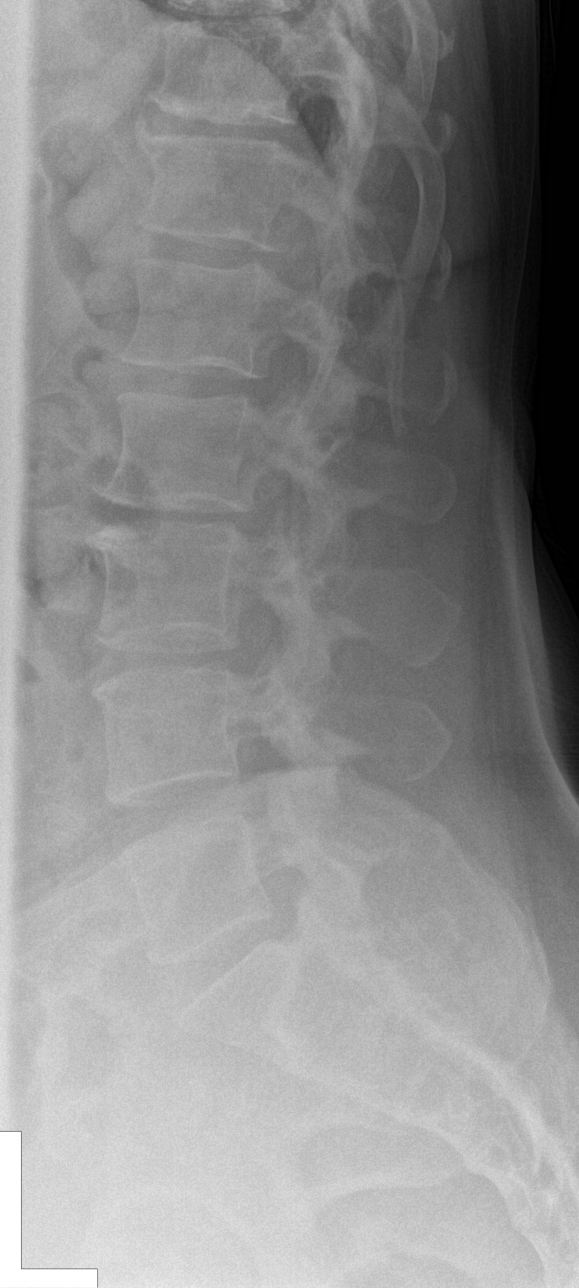

[l-spine spot]
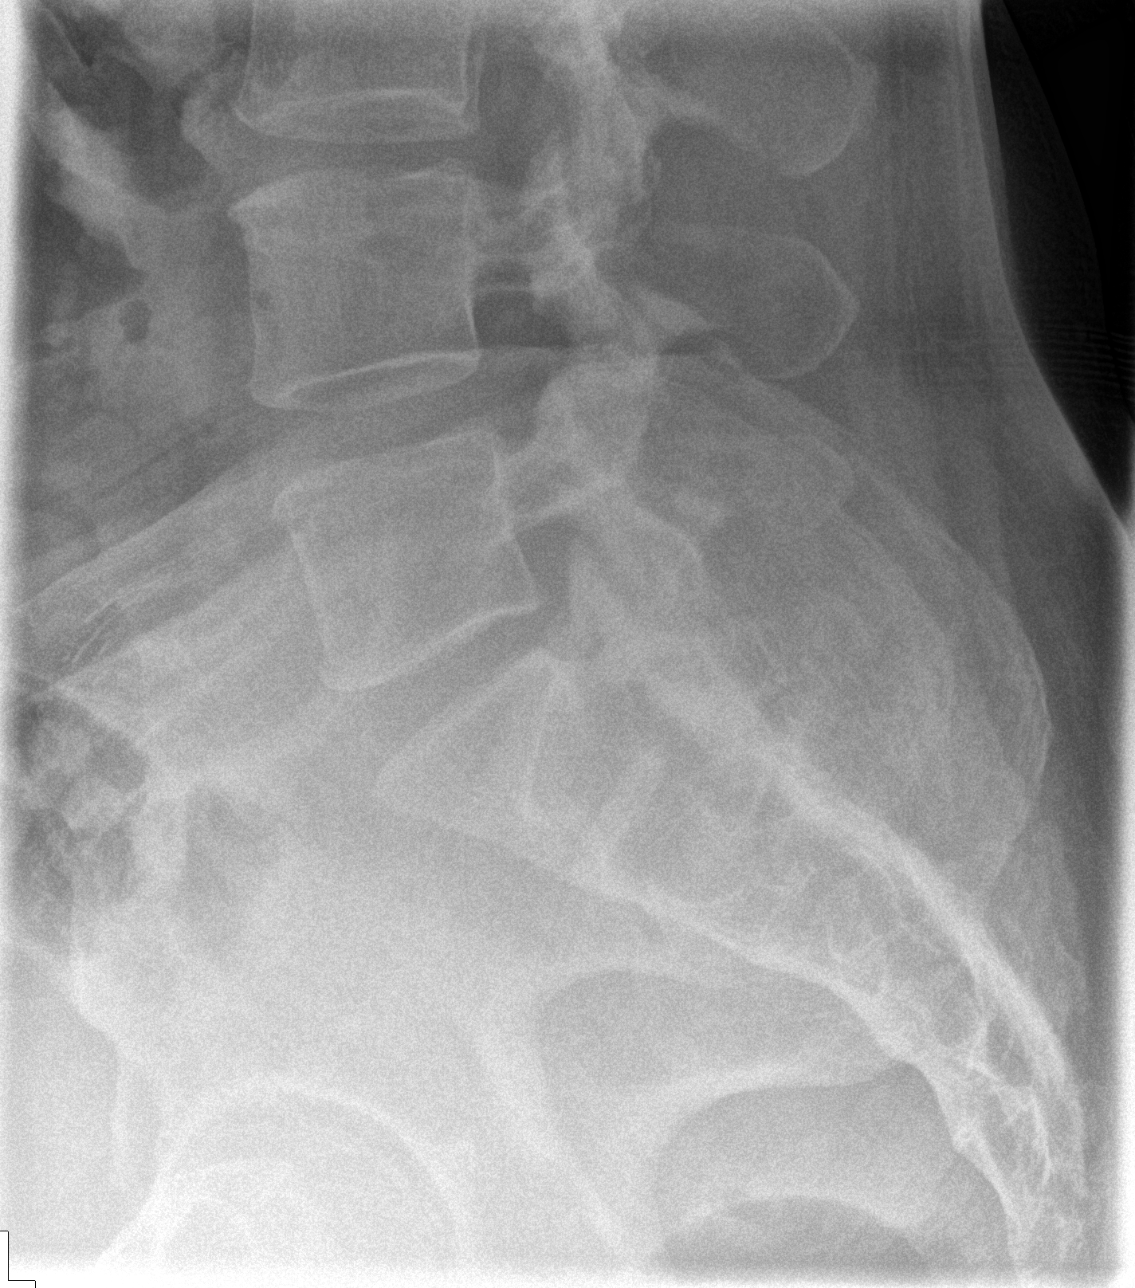

[5 of 5 positions shown; findings below may reference images not displayed]

FINDINGS: Normal lumbar segmentation. Bone mineralization is within normal
limits for age. Very mild levoconvex lumbar scoliosis. Otherwise
normal lumbar vertebral height and alignment. Moderate disc space
loss and degenerative spurring at L2-L3. Other lumbar disc spaces
are relatively preserved. Disc space loss and degenerative spurring
at T11-T12. Sacral ala and SI joints within normal limits. No pars
fracture.
IMPRESSION: No acute osseous abnormality identified in lumbar spine. Chronic
disc and endplate degeneration at L2-L3.

## 2016-01-03 ENCOUNTER — Ambulatory Visit: Payer: Medicare Other | Admitting: Physical Therapy

## 2016-01-03 DIAGNOSIS — M25511 Pain in right shoulder: Secondary | ICD-10-CM

## 2016-01-03 DIAGNOSIS — R29898 Other symptoms and signs involving the musculoskeletal system: Secondary | ICD-10-CM | POA: Diagnosis not present

## 2016-01-03 DIAGNOSIS — M25611 Stiffness of right shoulder, not elsewhere classified: Secondary | ICD-10-CM | POA: Diagnosis not present
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
## 2016-01-04 DIAGNOSIS — Z9889 Other specified postprocedural states: Secondary | ICD-10-CM | POA: Diagnosis not present

## 2016-01-09 ENCOUNTER — Ambulatory Visit: Payer: Medicare Other | Admitting: Physical Therapy

## 2016-01-09 DIAGNOSIS — R29898 Other symptoms and signs involving the musculoskeletal system: Secondary | ICD-10-CM

## 2016-01-09 DIAGNOSIS — M25511 Pain in right shoulder: Secondary | ICD-10-CM | POA: Diagnosis not present

## 2016-01-09 DIAGNOSIS — M25611 Stiffness of right shoulder, not elsewhere classified: Secondary | ICD-10-CM
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
## 2016-01-17 ENCOUNTER — Ambulatory Visit: Payer: Medicare Other | Admitting: Physical Therapy

## 2016-01-17 DIAGNOSIS — M25511 Pain in right shoulder: Secondary | ICD-10-CM

## 2016-01-17 DIAGNOSIS — M25611 Stiffness of right shoulder, not elsewhere classified: Secondary | ICD-10-CM | POA: Diagnosis not present

## 2016-01-17 DIAGNOSIS — R29898 Other symptoms and signs involving the musculoskeletal system: Secondary | ICD-10-CM | POA: Diagnosis not present
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
## 2016-01-24 ENCOUNTER — Ambulatory Visit: Payer: Medicare Other | Admitting: Physical Therapy

## 2016-01-24 DIAGNOSIS — M25611 Stiffness of right shoulder, not elsewhere classified: Secondary | ICD-10-CM

## 2016-01-24 DIAGNOSIS — M25511 Pain in right shoulder: Secondary | ICD-10-CM | POA: Diagnosis not present

## 2016-01-24 DIAGNOSIS — R29898 Other symptoms and signs involving the musculoskeletal system: Secondary | ICD-10-CM | POA: Diagnosis not present

## 2016-01-24 NOTE — Patient Instructions (Signed)
Pectoralis Stretch With Scapula Adduction: Supine (Towel Roll)    Lie with rolled towel under spine vertically. Hold arms out to side; you can raise them up as high as you can tolerate. Hold _3-5__ minutes, _2-3__ times per day.  http://ss.exer.us/357   Copyright  VHI. All rights reserved.   ROM: Inferior Glide - Abduction Below 90    With right arm resting comfortably on table, apply gentle force down through shoulder. Hold __10-20__ seconds. Relax. Repeat _3-5___ times per set. Do _1___ sets per session. Do __1-2__ sessions per day.  http://orth.exer.us/783   Copyright  VHI. All rights reserved.   ROM: Posterior / Inferior Glide - Flexion Below 90    With left arm resting comfortably on table, apply gentle force down and slightly backward through shoulder. Hold __15-20__ seconds. Relax. Repeat _3-5___ times per set. Do __1__ sets per session. Do __1-2__ sessions per day.  http://orth.exer.us/781   Copyright  VHI. All rights reserved.
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
## 2016-01-31 ENCOUNTER — Ambulatory Visit: Payer: Medicare Other | Attending: Orthopedic Surgery | Admitting: Physical Therapy

## 2016-01-31 DIAGNOSIS — R29898 Other symptoms and signs involving the musculoskeletal system: Secondary | ICD-10-CM | POA: Insufficient documentation

## 2016-01-31 DIAGNOSIS — M25611 Stiffness of right shoulder, not elsewhere classified: Secondary | ICD-10-CM | POA: Diagnosis not present

## 2016-01-31 DIAGNOSIS — M25511 Pain in right shoulder: Secondary | ICD-10-CM | POA: Insufficient documentation
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
## 2016-02-02 IMAGING — RF DG CHOLANGIOGRAM OPERATIVE
1 series · 8 of 8 positions shown · non-contrast
Comparison: 12/11/2014

CLINICAL DATA: Cholelithiasis.

EXAM:
INTRAOPERATIVE CHOLANGIOGRAM
TECHNIQUE: Cholangiographic images from the C-arm fluoroscopic device were
submitted for interpretation post-operatively. Please see the
procedural report for the amount of contrast and the fluoroscopy
time utilized.

[Series 1: run · 2 acquisitions, 8 frames shown]
[im 1/2]
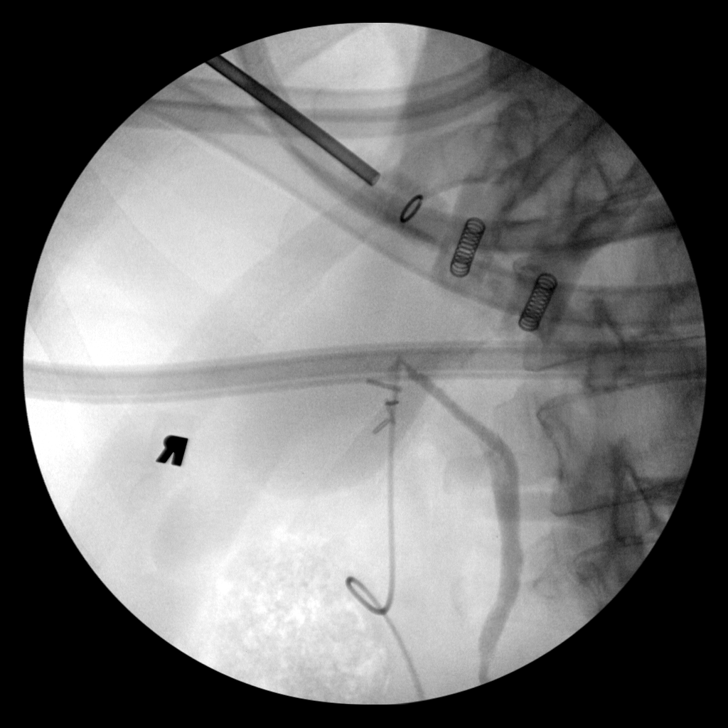
[im 1/2]
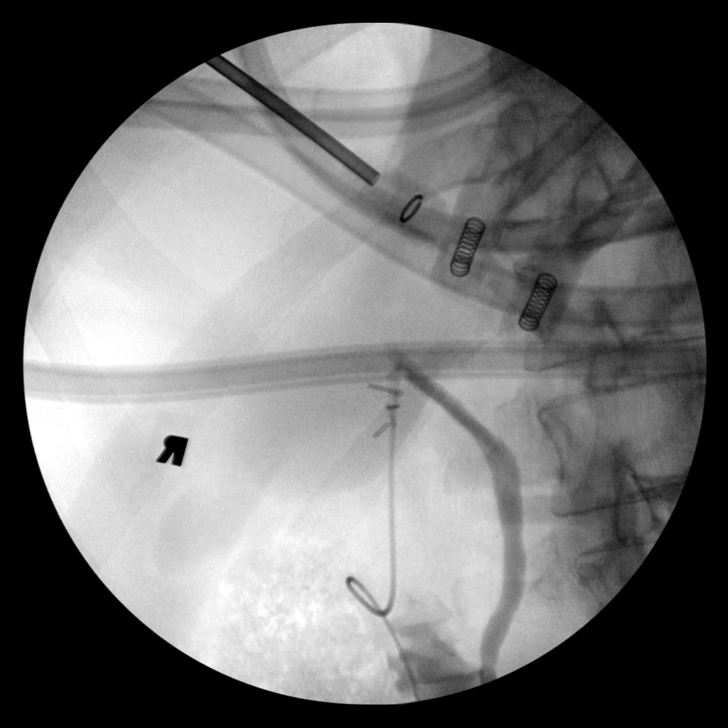
[im 1/2]
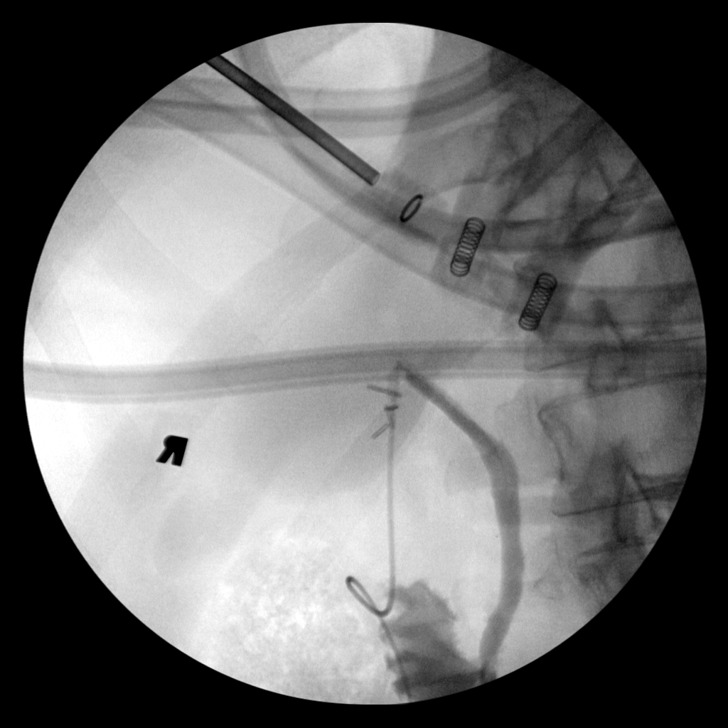
[im 1/2]
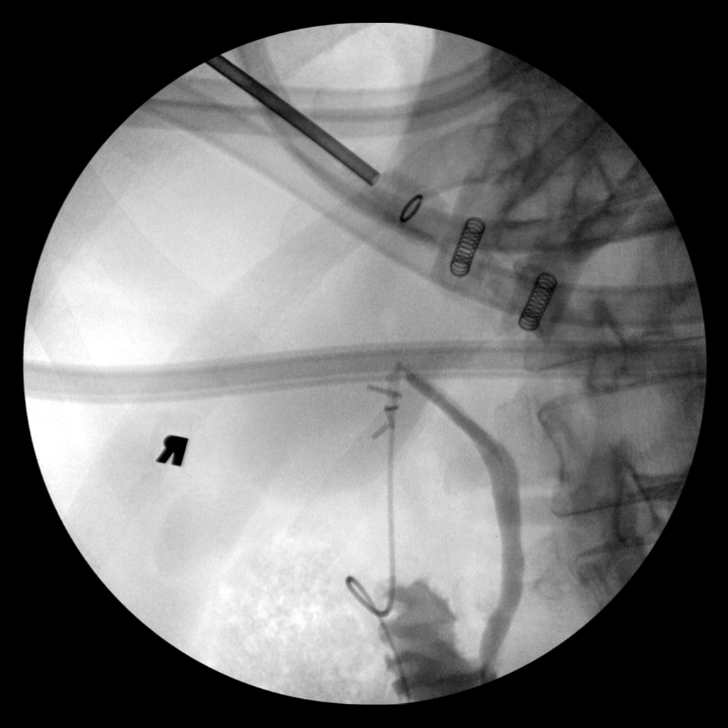
[im 2/2]
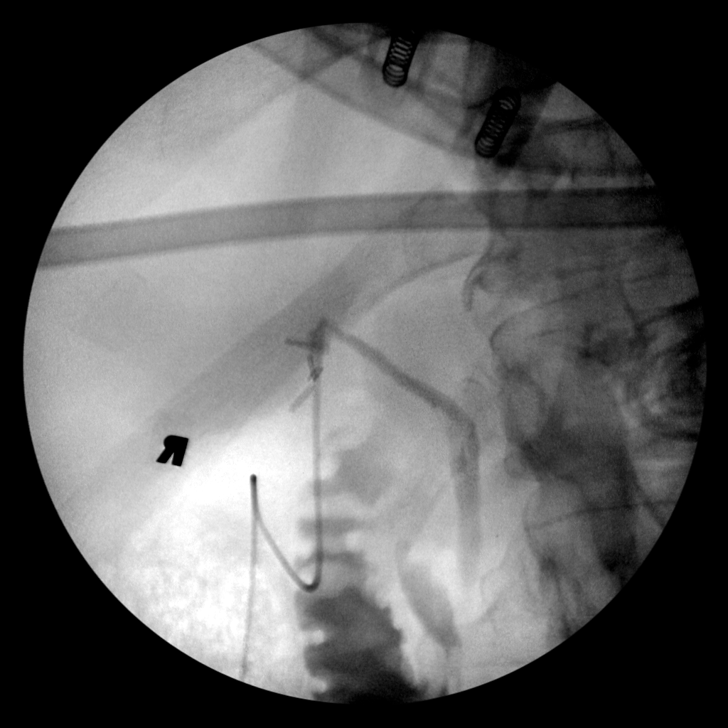
[im 2/2]
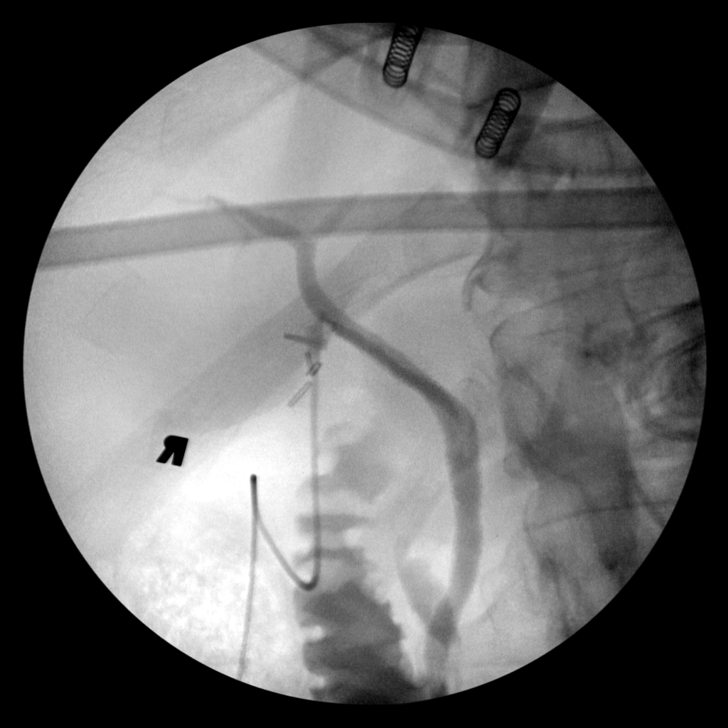
[im 2/2]
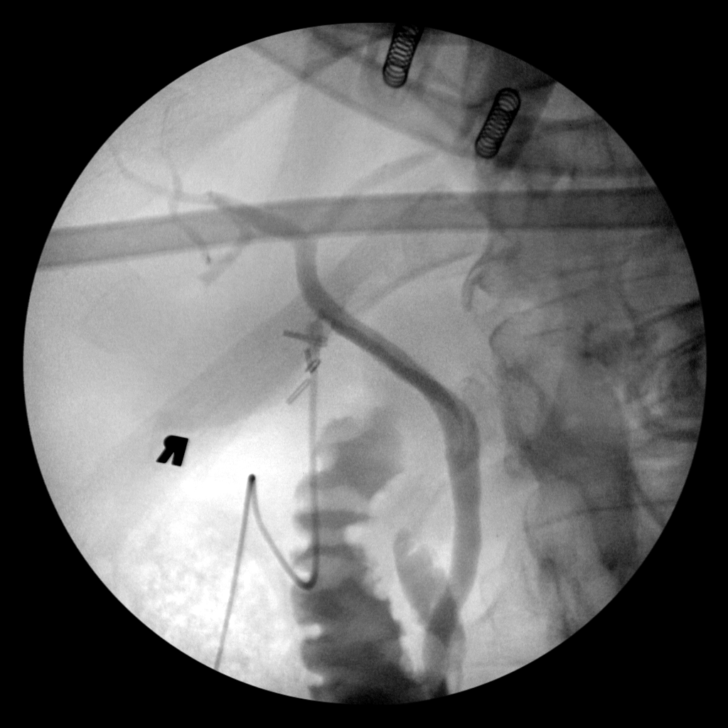
[im 2/2]
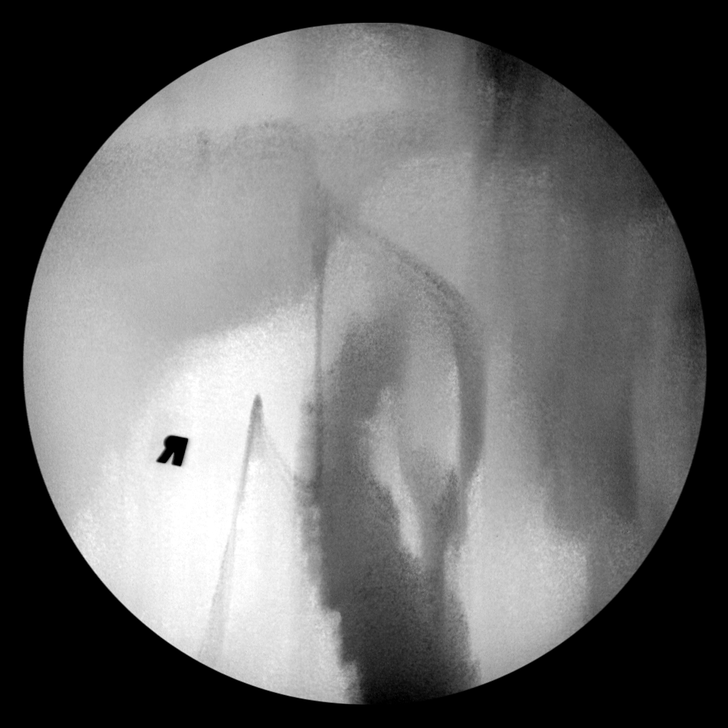

[8 of 8 positions shown; findings below may reference images not displayed]

FINDINGS: Contrast fills the cystic duct, common bile duct and the
intrahepatic biliary system. Contrast drains into the duodenum.
Filling defects in right intrahepatic drain branches probably
represent gas bubbles. There is a tubular shaped structure near the
ampulla which probably represents a duodenum diverticulum. Findings
are not compatible with a pancreatic duct.
IMPRESSION: Patent biliary system.

Probable duodenum diverticulum.

Filling defects in the intrahepatic biliary system likely related to
gas bubbles.

## 2016-02-06 ENCOUNTER — Ambulatory Visit: Payer: Medicare Other | Admitting: Physical Therapy

## 2016-02-06 DIAGNOSIS — M25511 Pain in right shoulder: Secondary | ICD-10-CM

## 2016-02-06 DIAGNOSIS — M25611 Stiffness of right shoulder, not elsewhere classified: Secondary | ICD-10-CM

## 2016-02-06 DIAGNOSIS — R29898 Other symptoms and signs involving the musculoskeletal system: Secondary | ICD-10-CM
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
## 2016-02-16 ENCOUNTER — Ambulatory Visit: Payer: Medicare Other

## 2016-02-21 ENCOUNTER — Ambulatory Visit: Payer: Medicare Other | Admitting: Physical Therapy

## 2016-02-21 DIAGNOSIS — M25511 Pain in right shoulder: Secondary | ICD-10-CM | POA: Diagnosis not present

## 2016-02-21 DIAGNOSIS — R29898 Other symptoms and signs involving the musculoskeletal system: Secondary | ICD-10-CM | POA: Diagnosis not present

## 2016-02-21 DIAGNOSIS — M25611 Stiffness of right shoulder, not elsewhere classified: Secondary | ICD-10-CM | POA: Diagnosis not present
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
## 2016-02-22 DIAGNOSIS — Z09 Encounter for follow-up examination after completed treatment for conditions other than malignant neoplasm: Secondary | ICD-10-CM | POA: Diagnosis not present

## 2016-03-01 ENCOUNTER — Ambulatory Visit: Payer: Medicare Other | Attending: Orthopedic Surgery | Admitting: Physical Therapy

## 2016-03-01 DIAGNOSIS — R29898 Other symptoms and signs involving the musculoskeletal system: Secondary | ICD-10-CM | POA: Diagnosis not present

## 2016-03-01 DIAGNOSIS — M25611 Stiffness of right shoulder, not elsewhere classified: Secondary | ICD-10-CM | POA: Diagnosis not present

## 2016-03-01 DIAGNOSIS — M25511 Pain in right shoulder: Secondary | ICD-10-CM
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
## 2016-10-29 DIAGNOSIS — L820 Inflamed seborrheic keratosis: Secondary | ICD-10-CM | POA: Diagnosis not present

## 2016-12-31 ENCOUNTER — Other Ambulatory Visit: Payer: Self-pay | Admitting: Obstetrics and Gynecology

## 2016-12-31 DIAGNOSIS — Z124 Encounter for screening for malignant neoplasm of cervix: Secondary | ICD-10-CM | POA: Diagnosis not present

## 2016-12-31 DIAGNOSIS — N6320 Unspecified lump in the left breast, unspecified quadrant: Secondary | ICD-10-CM

## 2016-12-31 DIAGNOSIS — Z6830 Body mass index (BMI) 30.0-30.9, adult: Secondary | ICD-10-CM | POA: Diagnosis not present

## 2016-12-31 DIAGNOSIS — N958 Other specified menopausal and perimenopausal disorders: Secondary | ICD-10-CM | POA: Diagnosis not present

## 2017-01-04 ENCOUNTER — Ambulatory Visit
Admission: RE | Admit: 2017-01-04 | Discharge: 2017-01-04 | Disposition: A | Discharge status: Home / Self Care | Payer: Medicare Other | Source: Ambulatory Visit | Attending: Obstetrics and Gynecology | Admitting: Obstetrics and Gynecology

## 2017-01-04 DIAGNOSIS — N6489 Other specified disorders of breast: Secondary | ICD-10-CM | POA: Diagnosis not present

## 2017-01-04 DIAGNOSIS — R928 Other abnormal and inconclusive findings on diagnostic imaging of breast: Secondary | ICD-10-CM | POA: Diagnosis not present

## 2017-01-04 DIAGNOSIS — N6320 Unspecified lump in the left breast, unspecified quadrant: Secondary | ICD-10-CM

## 2017-03-04 ENCOUNTER — Ambulatory Visit: Payer: Self-pay | Admitting: Family Medicine

## 2017-03-07 ENCOUNTER — Ambulatory Visit (INDEPENDENT_AMBULATORY_CARE_PROVIDER_SITE_OTHER): Payer: Medicare Other | Admitting: Family Medicine

## 2017-03-07 VITALS — BP 126/82 | HR 85 | Temp 98.1°F | Ht 67.0 in | Wt 195.4 lb

## 2017-03-07 DIAGNOSIS — M79605 Pain in left leg: Principal | ICD-10-CM

## 2017-03-07 DIAGNOSIS — M545 Low back pain, unspecified: Secondary | ICD-10-CM

## 2017-03-07 DIAGNOSIS — M79661 Pain in right lower leg: Secondary | ICD-10-CM | POA: Diagnosis not present

## 2017-03-07 DIAGNOSIS — R1011 Right upper quadrant pain: Secondary | ICD-10-CM

## 2017-03-07 DIAGNOSIS — M79662 Pain in left lower leg: Secondary | ICD-10-CM

## 2017-03-07 LAB — COMPREHENSIVE METABOLIC PANEL
ALT: 19 U/L (ref 0–35)
AST: 26 U/L (ref 0–37)
Albumin: 4.5 g/dL (ref 3.5–5.2)
Alkaline Phosphatase: 66 U/L (ref 39–117)
BUN: 10 mg/dL (ref 6–23)
CHLORIDE: 101 meq/L (ref 96–112)
CO2: 27 mEq/L (ref 19–32)
Calcium: 10.1 mg/dL (ref 8.4–10.5)
Creatinine, Ser: 0.9 mg/dL (ref 0.40–1.20)
GFR: 66.7 mL/min (ref >60.00)
GLUCOSE: 106 mg/dL — AB (ref 70–99)
POTASSIUM: 4 meq/L (ref 3.5–5.1)
Sodium: 138 mEq/L (ref 135–145)
Total Bilirubin: 0.9 mg/dL (ref 0.2–1.2)
Total Protein: 7.8 g/dL (ref 6.0–8.3)

## 2017-03-07 LAB — CBC
HCT: 39.3 % (ref 36.0–46.0)
Hemoglobin: 13.2 g/dL (ref 12.0–15.0)
MCHC: 33.5 g/dL (ref 30.0–36.0)
MCV: 86.9 fl (ref 78.0–100.0)
Platelets: 375 10*3/uL (ref 150.0–400.0)
RBC: 4.52 Mil/uL (ref 3.87–5.11)
RDW: 13.6 % (ref 11.5–15.5)
WBC: 5.6 10*3/uL (ref 4.0–10.5)

## 2017-03-07 MED ORDER — PREDNISONE 20 MG PO TABS: ORAL_TABLET | ORAL | 0 refills | Status: DC

## 2017-03-07 NOTE — Patient Instructions (Signed)
It was nice to see you today - I am sorry that you are hurting and hope that you feel much better soon! We are going to try a course of prednisone for your back pain; I am hoping that it may also help with your leg pain Take 2 pills a day for 5 days, then 1 pill a day for 5 days.  If you wish to shorten this and do 3 days and 3 days or 4 days and 4 days that is also ok While on prednisone avoid NSAID medications such as ibuprofen or aleve; tylenol would be ok We will do basic labs today to look for any clues regarding your abdominal pain Please update me regarding your condition in the next couple of days- seek care if you are getting worse

## 2017-03-07 NOTE — Progress Notes (Signed)
Pre visit review using our clinic review tool, if applicable. No additional management support is needed unless otherwise documented below in the visit note. 
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
## 2017-03-09 ENCOUNTER — Encounter: Payer: Self-pay | Admitting: Family Medicine

## 2017-03-15 ENCOUNTER — Encounter: Payer: Self-pay | Admitting: Family Medicine

## 2017-03-28 ENCOUNTER — Ambulatory Visit (INDEPENDENT_AMBULATORY_CARE_PROVIDER_SITE_OTHER): Payer: Medicare Other | Admitting: Family Medicine

## 2017-03-28 VITALS — BP 134/80 | HR 81 | Temp 98.4°F | Ht 67.0 in

## 2017-03-28 DIAGNOSIS — R1011 Right upper quadrant pain: Secondary | ICD-10-CM

## 2017-03-28 DIAGNOSIS — M79661 Pain in right lower leg: Secondary | ICD-10-CM

## 2017-03-28 DIAGNOSIS — M79662 Pain in left lower leg: Secondary | ICD-10-CM | POA: Diagnosis not present

## 2017-03-28 NOTE — Patient Instructions (Addendum)
We are going to get ultrasound evaluation of your liver, and also of the veins in your legs to make sure you do not have a blood clot.  We will plan the next step pending these results!  We are also requesting results from your hospital stay back in 2016- I want to see if you truly did have a deep vein thrombosis (blood clot)- if you have had one you are higher risk of having this again!
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
## 2017-03-28 NOTE — Progress Notes (Signed)
Pre visit review using our clinic review tool, if applicable. No additional management support is needed unless otherwise documented below in the visit note. 

## 2017-03-29 ENCOUNTER — Encounter: Payer: Self-pay | Admitting: Family Medicine

## 2017-04-02 ENCOUNTER — Encounter: Payer: Self-pay | Admitting: Family Medicine

## 2017-04-02 ENCOUNTER — Ambulatory Visit (HOSPITAL_BASED_OUTPATIENT_CLINIC_OR_DEPARTMENT_OTHER)
Admission: RE | Admit: 2017-04-02 | Discharge: 2017-04-02 | Disposition: A | Discharge status: Home / Self Care | Payer: Medicare Other | Source: Ambulatory Visit | Attending: Family Medicine | Admitting: Family Medicine

## 2017-04-02 DIAGNOSIS — Z9049 Acquired absence of other specified parts of digestive tract: Secondary | ICD-10-CM | POA: Insufficient documentation

## 2017-04-02 DIAGNOSIS — M79662 Pain in left lower leg: Secondary | ICD-10-CM | POA: Insufficient documentation

## 2017-04-02 DIAGNOSIS — R1011 Right upper quadrant pain: Secondary | ICD-10-CM

## 2017-04-02 DIAGNOSIS — M79661 Pain in right lower leg: Secondary | ICD-10-CM | POA: Diagnosis not present

## 2017-04-02 DIAGNOSIS — K7689 Other specified diseases of liver: Secondary | ICD-10-CM | POA: Diagnosis not present

## 2017-04-04 ENCOUNTER — Encounter: Payer: Self-pay | Admitting: Family Medicine

## 2017-04-04 NOTE — Telephone Encounter (Signed)
Patient checking on the status of my chart message best [phone] (M)

## 2017-04-05 ENCOUNTER — Encounter: Payer: Self-pay | Admitting: Family Medicine

## 2017-04-05 ENCOUNTER — Ambulatory Visit (INDEPENDENT_AMBULATORY_CARE_PROVIDER_SITE_OTHER): Payer: Medicare Other | Admitting: Family Medicine

## 2017-04-05 VITALS — BP 126/70 | HR 82 | Temp 97.8°F | Ht 67.0 in | Wt 196.4 lb

## 2017-04-05 DIAGNOSIS — M545 Low back pain: Secondary | ICD-10-CM

## 2017-04-05 MED ORDER — KETOROLAC TROMETHAMINE 60 MG/2ML IM SOLN
60.0000 mg | Freq: Once | INTRAMUSCULAR | Status: AC
  Administered 2017-04-05: 60 mg via INTRAMUSCULAR

## 2017-04-05 NOTE — Patient Instructions (Signed)
Tylenol 1000 mg (2 extra strength tabs) every 6 hours as needed for pain today and onwards.  No NSAIDs (Aspirin, Advil, Aleve, ibuprofen, Motrin, etc) for the rest of today. Advil 400 mg every 6 hours as needed for pain can be used with Tylenol for an additive effect. If this upsets your stomach, stop taking and stay with the Tylenol.  Keep doing your stretches!
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
## 2017-04-05 NOTE — Progress Notes (Signed)
Pre visit review using our clinic review tool, if applicable. No additional management support is needed unless otherwise documented below in the visit note. 

## 2017-04-24 ENCOUNTER — Encounter: Payer: Self-pay | Admitting: Gastroenterology

## 2017-04-24 ENCOUNTER — Ambulatory Visit (INDEPENDENT_AMBULATORY_CARE_PROVIDER_SITE_OTHER): Payer: Medicare Other | Admitting: Gastroenterology

## 2017-04-24 VITALS — BP 118/72 | HR 88 | Ht 67.0 in | Wt 200.0 lb

## 2017-04-24 DIAGNOSIS — R1011 Right upper quadrant pain: Secondary | ICD-10-CM | POA: Diagnosis not present

## 2017-04-24 DIAGNOSIS — R079 Chest pain, unspecified: Secondary | ICD-10-CM | POA: Diagnosis not present

## 2017-04-24 NOTE — Patient Instructions (Signed)
You have been scheduled for an endoscopy. Please follow written instructions given to you at your visit today. If you use inhalers (even only as needed), please bring them with you on the day of your procedure. Your physician has requested that you go to www.startemmi.com and enter the access code given to you at your visit today. This web site gives a general overview about your procedure. However, you should still follow specific instructions given to you by our office regarding your preparation for the procedure.  You will be due for a recall colonoscopy in 01/2019. We will send you a reminder in the mail when it gets closer to that time.  Thank you for choosing me and [facility].  Pricilla Riffle. Dagoberto Ligas., MD., [facility] Regal
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
## 2017-04-30 ENCOUNTER — Ambulatory Visit (AMBULATORY_SURGERY_CENTER): Payer: Medicare Other | Admitting: Gastroenterology

## 2017-04-30 ENCOUNTER — Encounter: Payer: Self-pay | Admitting: Gastroenterology

## 2017-04-30 VITALS — BP 156/78 | HR 71 | Temp 98.6°F | Resp 15 | Ht 67.0 in | Wt 200.0 lb

## 2017-04-30 DIAGNOSIS — R1011 Right upper quadrant pain: Secondary | ICD-10-CM | POA: Diagnosis not present

## 2017-04-30 DIAGNOSIS — F4310 Post-traumatic stress disorder, unspecified: Secondary | ICD-10-CM | POA: Diagnosis not present

## 2017-04-30 DIAGNOSIS — R079 Chest pain, unspecified: Secondary | ICD-10-CM | POA: Diagnosis not present

## 2017-04-30 DIAGNOSIS — M797 Fibromyalgia: Secondary | ICD-10-CM | POA: Diagnosis not present

## 2017-04-30 MED ORDER — SODIUM CHLORIDE 0.9 % IV SOLN: 500.0000 mL | INTRAVENOUS | Status: AC

## 2017-04-30 MED ORDER — PANTOPRAZOLE SODIUM 40 MG PO TBEC: 40.0000 mg | DELAYED_RELEASE_TABLET | Freq: Every day | ORAL | 3 refills | Status: AC

## 2017-04-30 NOTE — Patient Instructions (Signed)
YOU HAD AN ENDOSCOPIC PROCEDURE TODAY AT [facility]:   Refer to the procedure report that was given to you for any specific questions about what was found during the examination.  If the procedure report does not answer your questions, please call your gastroenterologist to clarify.  If you requested that your care partner not be given the details of your procedure findings, then the procedure report has been included in a sealed envelope for you to review at your convenience later.  YOU SHOULD EXPECT: Some feelings of bloating in the abdomen. Passage of more gas than usual.  Walking can help get rid of the air that was put into your GI tract during the procedure and reduce the bloating. If you had a lower endoscopy (such as a colonoscopy or flexible sigmoidoscopy) you may notice spotting of blood in your stool or on the toilet paper. If you underwent a bowel prep for your procedure, you may not have a normal bowel movement for a few days.  Please Note:  You might notice some irritation and congestion in your nose or some drainage.  This is from the oxygen used during your procedure.  There is no need for concern and it should clear up in a day or so.  SYMPTOMS TO REPORT IMMEDIATELY:    Following upper endoscopy (EGD)  Vomiting of blood or coffee ground material  New chest pain or pain under the shoulder blades  Painful or persistently difficult swallowing  New shortness of breath  Fever of 100F or higher  Black, tarry-looking stools  For urgent or emergent issues, a gastroenterologist can be reached at any hour by calling [phone].   DIET:  We do recommend a small meal at first, but then you may proceed to your regular diet.  Drink plenty of fluids but you should avoid alcoholic beverages for 24 hours.  ACTIVITY:  You should plan to take it easy for the rest of today and you should NOT DRIVE or use heavy machinery until tomorrow (because of the sedation medicines used  during the test).    FOLLOW UP: Our staff will call the number listed on your records the next business day following your procedure to check on you and address any questions or concerns that you may have regarding the information given to you following your procedure. If we do not reach you, we will leave a message.  However, if you are feeling well and you are not experiencing any problems, there is no need to return our call.  We will assume that you have returned to your regular daily activities without incident.  If any biopsies were taken you will be contacted by phone or by letter within the next 1-3 weeks.  Please call us at [phone] if you have not heard about the biopsies in 3 weeks.    SIGNATURES/CONFIDENTIALITY: You and/or your care partner have signed paperwork which will be entered into your electronic medical record.  These signatures attest to the fact that that the information above on your After Visit Summary has been reviewed and is understood.  Full responsibility of the confidentiality of this discharge information lies with you and/or your care-partner.  Esophagitis, anti reflux information given.  Continue medications.

## 2017-04-30 NOTE — Progress Notes (Signed)
Report to PACU, RN, vss, BBS= Clear.  

## 2017-04-30 NOTE — Op Note (Signed)
[facility] Patient Name: Maria Huber Procedure Date: 04/30/2017 9:44 AM MRN: [id number] Endoscopist: Ladene Artist , MD Age: 66 Referring MD:  Date of Birth: 07-14-1951 Gender: Female Account #: [id number] Procedure:                Upper GI endoscopy Indications:              Abdominal pain in the right upper quadrant,                            Unexplained chest pain Medicines:                Monitored Anesthesia Care Procedure:                Pre-Anesthesia Assessment:                           - Prior to the procedure, a History and Physical                            was performed, and patient medications and                            allergies were reviewed. The patient's tolerance of                            previous anesthesia was also reviewed. The risks                            and benefits of the procedure and the sedation                            options and risks were discussed with the patient.                            All questions were answered, and informed consent                            was obtained. Prior Anticoagulants: The patient has                            taken no previous anticoagulant or antiplatelet                            agents. ASA Grade Assessment: II - A patient with                            mild systemic disease. After reviewing the risks                            and benefits, the patient was deemed in                            satisfactory condition to undergo the procedure.  After obtaining informed consent, the endoscope was                            passed under direct vision. Throughout the                            procedure, the patient's blood pressure, pulse, and                            oxygen saturations were monitored continuously. The                            Endoscope was introduced through the mouth, and                            advanced to the second part of  duodenum. The upper                            GI endoscopy was accomplished without difficulty.                            The patient tolerated the procedure well. Scope In: Scope Out: Findings:                 LA Grade A (one or more mucosal breaks less than 5                            mm, not extending between tops of 2 mucosal folds)                            esophagitis with no bleeding was found at the                            gastroesophageal junction.                           The exam of the esophagus was otherwise normal.                           The entire examined stomach was normal.                           Localized mildly erythematous mucosa without active                            bleeding and with no stigmata of bleeding was found                            in the duodenal bulb and in the second portion of                            the duodenum. Complications:            No immediate complications. Estimated Blood Loss:     Estimated blood  loss: none. Impression:               - LA Grade A reflux esophagitis.                           - Normal stomach.                           - Erythematous duodenopathy.                           - No specimens collected. Recommendation:           - Patient has a contact number available for                            emergencies. The signs and symptoms of potential                            delayed complications were discussed with the                            patient. Return to normal activities tomorrow.                            Written discharge instructions were provided to the                            patient.                           - Resume previous diet.                           - Antireflux measures long term.                           - Continue present medications.                           - Prilosec (omeprazole) 40 mg PO daily, 1 year of                            refills. Ladene Artist,  MD 04/30/2017 10:02:14 AM This report has been signed electronically.

## 2017-04-30 NOTE — Progress Notes (Signed)
Pt spent additional time in recovery post procedure because she had additional question for Dr. Fuller Plan. Spoke with Dr. Fuller Plan.  Discharged with her husband.

## 2017-05-01 ENCOUNTER — Telehealth: Payer: Self-pay | Admitting: *Deleted

## 2017-05-01 NOTE — Telephone Encounter (Signed)
  Follow up Call-  Call back number 04/30/2017  Post procedure Call Back phone  # [phone]  Permission to leave phone message Yes  Some recent data might be hidden     Patient questions:  Message left to call us if necessary.

## 2017-05-02 ENCOUNTER — Telehealth: Payer: Self-pay | Admitting: *Deleted

## 2017-05-02 NOTE — Telephone Encounter (Signed)
  Follow up Call-  Call back number 04/30/2017  Post procedure Call Back phone  # [phone]  Permission to leave phone message Yes  Some recent data might be hidden     Patient questions:  Do you have a fever, pain , or abdominal swelling? No. Pain Score  0 *  Have you tolerated food without any problems? Yes.    Have you been able to return to your normal activities? Yes.    Do you have any questions about your discharge instructions: Diet   No. Medications  No. Follow up visit  No.  Do you have questions or concerns about your Care? No.  Actions: * If pain score is 4 or above: No action needed, pain <4.

## 2018-01-20 DIAGNOSIS — Z01419 Encounter for gynecological examination (general) (routine) without abnormal findings: Secondary | ICD-10-CM | POA: Diagnosis not present

## 2018-01-20 DIAGNOSIS — Z6831 Body mass index (BMI) 31.0-31.9, adult: Secondary | ICD-10-CM | POA: Diagnosis not present

## 2018-01-20 DIAGNOSIS — Z1231 Encounter for screening mammogram for malignant neoplasm of breast: Secondary | ICD-10-CM | POA: Diagnosis not present

## 2019-01-22 DIAGNOSIS — N958 Other specified menopausal and perimenopausal disorders: Secondary | ICD-10-CM | POA: Diagnosis not present

## 2019-01-22 DIAGNOSIS — Z1231 Encounter for screening mammogram for malignant neoplasm of breast: Secondary | ICD-10-CM | POA: Diagnosis not present

## 2019-01-22 DIAGNOSIS — Z6830 Body mass index (BMI) 30.0-30.9, adult: Secondary | ICD-10-CM | POA: Diagnosis not present

## 2019-01-22 DIAGNOSIS — Z01419 Encounter for gynecological examination (general) (routine) without abnormal findings: Secondary | ICD-10-CM | POA: Diagnosis not present

## 2019-02-17 ENCOUNTER — Telehealth: Payer: Self-pay

## 2019-02-17 NOTE — Telephone Encounter (Signed)
Not sure how to go about this or if there was anything on my end I could do?

## 2019-02-17 NOTE — Telephone Encounter (Signed)
Copied from [place] [phone]. 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

## 2019-02-20 DIAGNOSIS — M542 Cervicalgia: Secondary | ICD-10-CM | POA: Diagnosis not present

## 2019-02-21 ENCOUNTER — Encounter: Payer: Self-pay | Admitting: Gastroenterology

## 2019-02-25 ENCOUNTER — Other Ambulatory Visit: Payer: Self-pay | Admitting: Orthopedic Surgery

## 2019-02-25 DIAGNOSIS — M542 Cervicalgia: Secondary | ICD-10-CM

## 2019-03-11 ENCOUNTER — Ambulatory Visit
Admission: RE | Admit: 2019-03-11 | Discharge: 2019-03-11 | Disposition: A | Discharge status: Home / Self Care | Payer: Medicare Other | Source: Ambulatory Visit | Attending: Orthopedic Surgery | Admitting: Orthopedic Surgery

## 2019-03-11 ENCOUNTER — Other Ambulatory Visit: Payer: Self-pay

## 2019-03-11 DIAGNOSIS — M542 Cervicalgia: Secondary | ICD-10-CM

## 2019-07-24 ENCOUNTER — Other Ambulatory Visit: Payer: Self-pay
